# Patient Record
Sex: Male | Born: 1950 | Race: White | Hispanic: No | Marital: Married | Smoking: Former smoker
Health system: Southern US, Community
[De-identification: ages and names within clinical notes are randomized; demographics above are authoritative.]

## PROBLEM LIST (undated history)

## (undated) DIAGNOSIS — K219 Gastro-esophageal reflux disease without esophagitis: Secondary | ICD-10-CM

## (undated) DIAGNOSIS — M25519 Pain in unspecified shoulder: Secondary | ICD-10-CM

## (undated) DIAGNOSIS — M1990 Unspecified osteoarthritis, unspecified site: Secondary | ICD-10-CM

## (undated) HISTORY — PX: REFRACTIVE SURGERY: SHX103

---

## 2000-06-01 HISTORY — PX: TOTAL KNEE ARTHROPLASTY: SHX125

## 2000-08-16 ENCOUNTER — Inpatient Hospital Stay (HOSPITAL_COMMUNITY): Admission: RE | Admit: 2000-08-16 | Discharge: 2000-08-19 | Payer: Self-pay | Admitting: Orthopedic Surgery

## 2000-09-13 ENCOUNTER — Encounter: Admission: RE | Admit: 2000-09-13 | Discharge: 2000-10-27 | Payer: Self-pay | Admitting: Orthopedic Surgery

## 2012-09-16 ENCOUNTER — Encounter (HOSPITAL_BASED_OUTPATIENT_CLINIC_OR_DEPARTMENT_OTHER): Payer: Self-pay

## 2012-09-16 ENCOUNTER — Emergency Department (HOSPITAL_BASED_OUTPATIENT_CLINIC_OR_DEPARTMENT_OTHER): Payer: Federal, State, Local not specified - PPO

## 2012-09-16 ENCOUNTER — Emergency Department (HOSPITAL_BASED_OUTPATIENT_CLINIC_OR_DEPARTMENT_OTHER)
Admission: EM | Admit: 2012-09-16 | Discharge: 2012-09-16 | Disposition: A | Discharge status: Home / Self Care | Payer: Federal, State, Local not specified - PPO | Attending: Emergency Medicine | Admitting: Emergency Medicine

## 2012-09-16 DIAGNOSIS — J069 Acute upper respiratory infection, unspecified: Secondary | ICD-10-CM | POA: Insufficient documentation

## 2012-09-16 DIAGNOSIS — R51 Headache: Secondary | ICD-10-CM | POA: Insufficient documentation

## 2012-09-16 DIAGNOSIS — J209 Acute bronchitis, unspecified: Secondary | ICD-10-CM | POA: Insufficient documentation

## 2012-09-16 DIAGNOSIS — J3489 Other specified disorders of nose and nasal sinuses: Secondary | ICD-10-CM | POA: Insufficient documentation

## 2012-09-16 DIAGNOSIS — Z8639 Personal history of other endocrine, nutritional and metabolic disease: Secondary | ICD-10-CM | POA: Insufficient documentation

## 2012-09-16 DIAGNOSIS — J40 Bronchitis, not specified as acute or chronic: Secondary | ICD-10-CM

## 2012-09-16 DIAGNOSIS — Z862 Personal history of diseases of the blood and blood-forming organs and certain disorders involving the immune mechanism: Secondary | ICD-10-CM | POA: Insufficient documentation

## 2012-09-16 DIAGNOSIS — Z79899 Other long term (current) drug therapy: Secondary | ICD-10-CM | POA: Insufficient documentation

## 2012-09-16 MED ORDER — AZITHROMYCIN 250 MG PO TABS: ORAL_TABLET | ORAL | Status: DC

## 2012-09-16 MED ORDER — ALBUTEROL SULFATE HFA 108 (90 BASE) MCG/ACT IN AERS: 1.0000 | INHALATION_SPRAY | Freq: Four times a day (QID) | RESPIRATORY_TRACT | Status: DC | PRN

## 2012-09-16 NOTE — ED Notes (Signed)
Pt states that he has had URI symptoms x1 week, pcp not available until Monday.

## 2012-09-16 NOTE — ED Provider Notes (Signed)
History     CSN: [id number]  Arrival date & time 09/16/12  1649   First MD Initiated Contact with Patient 09/16/12 1819      Chief Complaint  Patient presents with  . URI    (Consider location/radiation/quality/duration/timing/severity/associated sxs/prior treatment) Patient is a 61 y.o. male presenting with URI. The history is provided by the patient. A language interpreter was used.  URI Presenting symptoms: congestion and cough   Severity:  Severe Onset quality:  Gradual Duration:  1 week Timing:  Constant Progression:  Worsening Chronicity:  New Relieved by:  None tried Worsened by:  Nothing tried Ineffective treatments:  OTC medications Associated symptoms: headaches and sinus pain   Risk factors: not elderly   Pt complains of pain in his sinuses.  Pt reports he is coughing up dark phelgm.   Pt reports he has had a lot of sinus congestion.  Pt has appointment with MD on Monday but did not want to wait if he needed an antibiotic  Past Medical History  Diagnosis Date  . Hyperlipidemia     Past Surgical History  Procedure Laterality Date  . Total knee arthroplasty      History reviewed. No pertinent family history.  History  Substance Use Topics  . Smoking status: Never Smoker   . Smokeless tobacco: Never Used  . Alcohol Use: Yes     Comment: social use      Review of Systems  HENT: Positive for congestion.   Respiratory: Positive for cough.   Neurological: Positive for headaches.  All other systems reviewed and are negative.    Allergies  Morphine and related  Home Medications   Current Outpatient Rx  Name  Route  Sig  Dispense  Refill  . ibuprofen (ADVIL,MOTRIN) 200 MG tablet   Oral   Take 200 mg by mouth every 4 (four) hours as needed for pain.         Marland Kitchen loratadine (CLARITIN) 10 MG tablet   Oral   Take 10 mg by mouth daily.         Marland Kitchen zolpidem (AMBIEN) 5 MG tablet   Oral   Take 5 mg by mouth at bedtime as needed for sleep.          Marland Kitchen albuterol (PROVENTIL HFA;VENTOLIN HFA) 108 (90 BASE) MCG/ACT inhaler   Inhalation   Inhale 1-2 puffs into the lungs every 6 (six) hours as needed for wheezing.   1 Inhaler   0   . azithromycin (ZITHROMAX Z-PAK) 250 MG tablet      2 tablets 1st day then 1 tablet a day days 2-5   6 tablet   0     BP 119/77  Pulse 71  Temp(Src) 97.9 F (36.6 C) (Oral)  Resp 20  Ht 5\' 8"  (1.727 m)  Wt 220 lb (99.791 kg)  BMI 33.46 kg/m2  SpO2 96%  Physical Exam  Nursing note and vitals reviewed. Constitutional: He appears well-developed and well-nourished.  HENT:  Head: Normocephalic.  Right Ear: External ear normal.  Left Ear: External ear normal.  Mouth/Throat: Oropharynx is clear and moist.  Eyes: Conjunctivae are normal. Pupils are equal, round, and reactive to light.  Neck: Normal range of motion. Neck supple.  Cardiovascular: Normal rate and normal heart sounds.   Pulmonary/Chest: Effort normal and breath sounds normal.  Abdominal: Soft. Bowel sounds are normal.  Musculoskeletal: Normal range of motion.  Neurological: He is alert.  Skin: Skin is warm.  Psychiatric: He has a normal  mood and affect.    ED Course  Procedures (including critical care time)  Labs Reviewed - No data to display Dg Chest 2 View  09/16/2012  *RADIOLOGY REPORT*  Clinical Data: Cough, congestion, wheezing.  CHEST - 2 VIEW  Comparison: None.  Findings: Mild hyperinflation. Heart and mediastinal contours are within normal limits.  No focal opacities or effusions.  No acute bony abnormality.  IMPRESSION: Mild COPD.   Original Report Authenticated By: Charlett Nose, M.D.      1. Bronchitis       MDM  Pt given rx for zithromax and albuterol.  Pt given disc of his chest xray for his MD.  Pt advised to see his MD as scheduled for evaluation on Tuesday.         Elson Areas, PA-C 09/16/12 2013

## 2012-09-17 NOTE — ED Provider Notes (Signed)
Medical screening examination/treatment/procedure(s) were performed by non-physician practitioner and as supervising physician I was immediately available for consultation/collaboration.   Erich Kochan III, MD 09/17/12 0033 

## 2013-11-22 ENCOUNTER — Other Ambulatory Visit (HOSPITAL_COMMUNITY): Payer: Self-pay | Admitting: Orthopaedic Surgery

## 2013-11-29 ENCOUNTER — Encounter (HOSPITAL_COMMUNITY): Payer: Self-pay | Admitting: Pharmacy Technician

## 2013-12-02 DIAGNOSIS — M25519 Pain in unspecified shoulder: Secondary | ICD-10-CM

## 2013-12-02 HISTORY — DX: Pain in unspecified shoulder: M25.519

## 2013-12-05 ENCOUNTER — Encounter (INDEPENDENT_AMBULATORY_CARE_PROVIDER_SITE_OTHER): Payer: Self-pay

## 2013-12-05 ENCOUNTER — Encounter (HOSPITAL_COMMUNITY)
Admission: RE | Admit: 2013-12-05 | Discharge: 2013-12-05 | Disposition: A | Discharge status: Home / Self Care | Payer: Medicare Other | Source: Ambulatory Visit | Attending: Orthopaedic Surgery | Admitting: Orthopaedic Surgery

## 2013-12-05 ENCOUNTER — Encounter (HOSPITAL_COMMUNITY): Payer: Self-pay

## 2013-12-05 DIAGNOSIS — Z01812 Encounter for preprocedural laboratory examination: Secondary | ICD-10-CM | POA: Insufficient documentation

## 2013-12-05 HISTORY — DX: Pain in unspecified shoulder: M25.519

## 2013-12-05 HISTORY — DX: Unspecified osteoarthritis, unspecified site: M19.90

## 2013-12-05 HISTORY — DX: Gastro-esophageal reflux disease without esophagitis: K21.9

## 2013-12-05 LAB — BASIC METABOLIC PANEL
ANION GAP: 13 (ref 5–15)
BUN: 17 mg/dL (ref 6–23)
CALCIUM: 9.2 mg/dL (ref 8.4–10.5)
CO2: 23 meq/L (ref 19–32)
Chloride: 99 mEq/L (ref 96–112)
Creatinine, Ser: 0.85 mg/dL (ref 0.50–1.35)
GFR, EST NON AFRICAN AMERICAN: 89 mL/min — AB (ref >90)
Glucose, Bld: 84 mg/dL (ref 70–99)
Potassium: 4.3 mEq/L (ref 3.7–5.3)
SODIUM: 135 meq/L — AB (ref 137–147)

## 2013-12-05 LAB — CBC
HCT: 41.7 % (ref 39.0–52.0)
Hemoglobin: 14.7 g/dL (ref 13.0–17.0)
MCH: 31.9 pg (ref 26.0–34.0)
MCHC: 35.3 g/dL (ref 30.0–36.0)
MCV: 90.5 fL (ref 78.0–100.0)
PLATELETS: 194 10*3/uL (ref 150–400)
RBC: 4.61 MIL/uL (ref 4.22–5.81)
RDW: 13.1 % (ref 11.5–15.5)
WBC: 6.4 10*3/uL (ref 4.0–10.5)

## 2013-12-05 LAB — URINALYSIS, ROUTINE W REFLEX MICROSCOPIC
Bilirubin Urine: NEGATIVE
Glucose, UA: NEGATIVE mg/dL
Hgb urine dipstick: NEGATIVE
Ketones, ur: NEGATIVE mg/dL
Leukocytes, UA: NEGATIVE
NITRITE: NEGATIVE
PROTEIN: NEGATIVE mg/dL
SPECIFIC GRAVITY, URINE: 1.021 (ref 1.005–1.030)
UROBILINOGEN UA: 0.2 mg/dL (ref 0.0–1.0)
pH: 7.5 (ref 5.0–8.0)

## 2013-12-05 LAB — SURGICAL PCR SCREEN
MRSA, PCR: NEGATIVE
STAPHYLOCOCCUS AUREUS: NEGATIVE

## 2013-12-05 LAB — PROTIME-INR
INR: 0.97 (ref 0.00–1.49)
Prothrombin Time: 12.9 seconds (ref 11.6–15.2)

## 2013-12-05 LAB — APTT: APTT: 30 s (ref 24–37)

## 2013-12-05 NOTE — Patient Instructions (Addendum)
18 Jeremy Norman  12/05/2013   Your procedure is scheduled on: Friday 12/15/13  Report to [facility] at 10:00 AM.  Call this number if you have problems the morning of surgery [phone]: [phone]   Remember:   Do not eat food or drink liquids After Midnight.   Do not wear jewelry, make-up or nail polish.  Do not wear lotions, powders, or perfumes. You may wear deodorant.  Do not shave 48 hours prior to surgery. Men may shave face and neck.  Do not bring valuables to the hospital.  Contacts, dentures or bridgework may not be worn into surgery.  Leave suitcase in the car. After surgery it may be brought to your room.  For patients admitted to the hospital, checkout time is 11:00 AM the day of discharge.   Please read over the following fact sheets that you were given: MRSA Information Paulette Blanch, RN  pre op nurse call if needed [phone]    [facility] - Preparing for Surgery Before surgery, you can play an important role.  Because skin is not sterile, your skin needs to be as free of germs as possible.  You can reduce the number of germs on your skin by washing with CHG (chlorahexidine gluconate) soap before surgery.  CHG is an antiseptic cleaner which kills germs and bonds with the skin to continue killing germs even after washing. Please DO NOT use if you have an allergy to CHG or antibacterial soaps.  If your skin becomes reddened/irritated stop using the CHG and inform your nurse when you arrive at Short Stay. Do not shave (including legs and underarms) for at least 48 hours prior to the first CHG shower.  You may shave your face/neck. Please follow these instructions carefully:  1.  Shower with CHG Soap the night before surgery and the  morning of Surgery.  2.  If you choose to wash your hair, wash your hair first as usual with your  normal  shampoo.  3.  After you shampoo, rinse your hair and body thoroughly to remove the  shampoo.                            4.   Use CHG as you would any other liquid soap.  You can apply chg directly  to the skin and wash                       Gently with a scrungie or clean washcloth.  5.  Apply the CHG Soap to your body ONLY FROM THE NECK DOWN.   Do not use on face/ open                           Wound or open sores. Avoid contact with eyes, ears mouth and genitals (private parts).                       Wash face,  Genitals (private parts) with your normal soap.             6.  Wash thoroughly, paying special attention to the area where your surgery  will be performed.  7.  Thoroughly rinse your body with warm water from the neck down.  8.  DO NOT shower/wash with your normal soap after using and rinsing off  the CHG Soap.  9.  Pat yourself dry with a clean towel.            10.  Wear clean pajamas.            11.  Place clean sheets on your bed the night of your first shower and do not  sleep with pets. Day of Surgery : Do not apply any lotions/deodorants the morning of surgery.  Please wear clean clothes to the hospital/surgery center.  FAILURE TO FOLLOW THESE INSTRUCTIONS MAY RESULT IN THE CANCELLATION OF YOUR SURGERY PATIENT SIGNATURE_________________________________  NURSE SIGNATURE__________________________________  ________________________________________________________________________  WHAT IS A BLOOD TRANSFUSION? Blood Transfusion Information  A transfusion is the replacement of blood or some of its parts. Blood is made up of multiple cells which provide different functions.  Red blood cells carry oxygen and are used for blood loss replacement.  White blood cells fight against infection.  Platelets control bleeding.  Plasma helps clot blood.  Other blood products are available for specialized needs, such as hemophilia or other clotting disorders. BEFORE THE TRANSFUSION  Who gives blood for transfusions?   Healthy volunteers who are fully evaluated to make sure their blood is  safe. This is blood bank blood. Transfusion therapy is the safest it has ever been in the practice of medicine. Before blood is taken from a donor, a complete history is taken to make sure that person has no history of diseases nor engages in risky social behavior (examples are intravenous drug use or sexual activity with multiple partners). The donor's travel history is screened to minimize risk of transmitting infections, such as malaria. The donated blood is tested for signs of infectious diseases, such as HIV and hepatitis. The blood is then tested to be sure it is compatible with you in order to minimize the chance of a transfusion reaction. If you or a relative donates blood, this is often done in anticipation of surgery and is not appropriate for emergency situations. It takes many days to process the donated blood. RISKS AND COMPLICATIONS Although transfusion therapy is very safe and saves many lives, the main dangers of transfusion include:   Getting an infectious disease.  Developing a transfusion reaction. This is an allergic reaction to something in the blood you were given. Every precaution is taken to prevent this. The decision to have a blood transfusion has been considered carefully by your caregiver before blood is given. Blood is not given unless the benefits outweigh the risks. AFTER THE TRANSFUSION  Right after receiving a blood transfusion, you will usually feel much better and more energetic. This is especially true if your red blood cells have gotten low (anemic). The transfusion raises the level of the red blood cells which carry oxygen, and this usually causes an energy increase.  The nurse administering the transfusion will monitor you carefully for complications. HOME CARE INSTRUCTIONS  No special instructions are needed after a transfusion. You may find your energy is better. Speak with your caregiver about any limitations on activity for underlying diseases you may  have. SEEK MEDICAL CARE IF:   Your condition is not improving after your transfusion.  You develop redness or irritation at the intravenous (IV) site. SEEK IMMEDIATE MEDICAL CARE IF:  Any of the following symptoms occur over the next 12 hours:  Shaking chills.  You have a temperature by mouth above 102 F (38.9 C), not controlled by medicine.  Chest, back, or muscle pain.  People around you feel you are not acting correctly or  are confused.  Shortness of breath or difficulty breathing.  Dizziness and fainting.  You get a rash or develop hives.  You have a decrease in urine output.  Your urine turns a dark color or changes to pink, red, or brown. Any of the following symptoms occur over the next 10 days:  You have a temperature by mouth above 102 F (38.9 C), not controlled by medicine.  Shortness of breath.  Weakness after normal activity.  The white part of the eye turns yellow (jaundice).  You have a decrease in the amount of urine or are urinating less often.  Your urine turns a dark color or changes to pink, red, or brown. Document Released: 05/15/2000 Document Revised: 08/10/2011 Document Reviewed: 01/02/2008 [facility] Patient Information 2014 [place], [place].  _______________________________________________________________________

## 2013-12-15 ENCOUNTER — Inpatient Hospital Stay (HOSPITAL_COMMUNITY): Payer: Medicare Other

## 2013-12-15 ENCOUNTER — Inpatient Hospital Stay (HOSPITAL_COMMUNITY)
Admission: RE | Admit: 2013-12-15 | Discharge: 2013-12-17 | DRG: 470 | Disposition: A | Discharge status: Home Health | Payer: Medicare Other | Source: Ambulatory Visit | Attending: Orthopaedic Surgery | Admitting: Orthopaedic Surgery

## 2013-12-15 ENCOUNTER — Inpatient Hospital Stay (HOSPITAL_COMMUNITY): Payer: Medicare Other | Admitting: Anesthesiology

## 2013-12-15 ENCOUNTER — Encounter (HOSPITAL_COMMUNITY): Payer: Self-pay | Admitting: *Deleted

## 2013-12-15 ENCOUNTER — Encounter (HOSPITAL_COMMUNITY): Payer: Medicare Other | Admitting: Anesthesiology

## 2013-12-15 ENCOUNTER — Encounter (HOSPITAL_COMMUNITY)
Admission: RE | Disposition: A | Discharge status: Home Health | Payer: Self-pay | Source: Ambulatory Visit | Attending: Orthopaedic Surgery

## 2013-12-15 DIAGNOSIS — Z01812 Encounter for preprocedural laboratory examination: Secondary | ICD-10-CM | POA: Diagnosis not present

## 2013-12-15 DIAGNOSIS — M25459 Effusion, unspecified hip: Secondary | ICD-10-CM | POA: Diagnosis present

## 2013-12-15 DIAGNOSIS — M1610 Unilateral primary osteoarthritis, unspecified hip: Secondary | ICD-10-CM | POA: Diagnosis present

## 2013-12-15 DIAGNOSIS — Z87891 Personal history of nicotine dependence: Secondary | ICD-10-CM | POA: Diagnosis not present

## 2013-12-15 DIAGNOSIS — Z96659 Presence of unspecified artificial knee joint: Secondary | ICD-10-CM | POA: Diagnosis not present

## 2013-12-15 DIAGNOSIS — M76899 Other specified enthesopathies of unspecified lower limb, excluding foot: Secondary | ICD-10-CM | POA: Diagnosis present

## 2013-12-15 DIAGNOSIS — K219 Gastro-esophageal reflux disease without esophagitis: Secondary | ICD-10-CM | POA: Diagnosis present

## 2013-12-15 DIAGNOSIS — M25559 Pain in unspecified hip: Secondary | ICD-10-CM | POA: Diagnosis present

## 2013-12-15 DIAGNOSIS — Z885 Allergy status to narcotic agent status: Secondary | ICD-10-CM | POA: Diagnosis not present

## 2013-12-15 DIAGNOSIS — Z96649 Presence of unspecified artificial hip joint: Secondary | ICD-10-CM

## 2013-12-15 DIAGNOSIS — M1611 Unilateral primary osteoarthritis, right hip: Secondary | ICD-10-CM

## 2013-12-15 DIAGNOSIS — M169 Osteoarthritis of hip, unspecified: Principal | ICD-10-CM | POA: Diagnosis present

## 2013-12-15 HISTORY — PX: TOTAL HIP ARTHROPLASTY: SHX124

## 2013-12-15 LAB — ABO/RH: ABO/RH(D): A POS

## 2013-12-15 LAB — TYPE AND SCREEN
ABO/RH(D): A POS
ANTIBODY SCREEN: NEGATIVE

## 2013-12-15 SURGERY — ARTHROPLASTY, HIP, TOTAL, ANTERIOR APPROACH
Anesthesia: Spinal | Site: Hip | Laterality: Right

## 2013-12-15 MED ORDER — POLYETHYLENE GLYCOL 3350 17 G PO PACK
17.0000 g | PACK | Freq: Every day | ORAL | Status: DC | PRN
  Administered 2013-12-16: 17 g via ORAL

## 2013-12-15 MED ORDER — OXYCODONE HCL 5 MG PO TABS
5.0000 mg | ORAL_TABLET | ORAL | Status: DC | PRN
  Administered 2013-12-15 – 2013-12-16 (×6): 10 mg via ORAL
  Administered 2013-12-16: 15 mg via ORAL
  Administered 2013-12-16 – 2013-12-17 (×6): 10 mg via ORAL
  Filled 2013-12-15 (×9): qty 2
  Filled 2013-12-15: qty 3
  Filled 2013-12-15 (×3): qty 2

## 2013-12-15 MED ORDER — HYDROMORPHONE HCL PF 1 MG/ML IJ SOLN: 1.0000 mg | INTRAMUSCULAR | Status: DC | PRN

## 2013-12-15 MED ORDER — TRANEXAMIC ACID 100 MG/ML IV SOLN
1000.0000 mg | INTRAVENOUS | Status: AC
  Administered 2013-12-15: 1000 mg via INTRAVENOUS
  Filled 2013-12-15: qty 10

## 2013-12-15 MED ORDER — PROMETHAZINE HCL 25 MG/ML IJ SOLN: 6.2500 mg | INTRAMUSCULAR | Status: DC | PRN

## 2013-12-15 MED ORDER — DIPHENHYDRAMINE HCL 12.5 MG/5ML PO ELIX: 12.5000 mg | ORAL_SOLUTION | ORAL | Status: DC | PRN

## 2013-12-15 MED ORDER — FENTANYL CITRATE 0.05 MG/ML IJ SOLN
INTRAMUSCULAR | Status: DC | PRN
  Administered 2013-12-15: 50 ug via INTRAVENOUS

## 2013-12-15 MED ORDER — BUPIVACAINE HCL (PF) 0.5 % IJ SOLN
INTRAMUSCULAR | Status: AC
  Filled 2013-12-15: qty 30

## 2013-12-15 MED ORDER — SODIUM CHLORIDE 0.9 % IR SOLN
Status: DC | PRN
  Administered 2013-12-15: 1000 mL

## 2013-12-15 MED ORDER — METOCLOPRAMIDE HCL 5 MG/ML IJ SOLN
5.0000 mg | Freq: Three times a day (TID) | INTRAMUSCULAR | Status: DC | PRN
  Administered 2013-12-15: 10 mg via INTRAVENOUS
  Filled 2013-12-15: qty 2

## 2013-12-15 MED ORDER — SODIUM CHLORIDE 0.9 % IV SOLN
INTRAVENOUS | Status: DC
  Administered 2013-12-15 – 2013-12-16 (×2): via INTRAVENOUS

## 2013-12-15 MED ORDER — ALUM & MAG HYDROXIDE-SIMETH 200-200-20 MG/5ML PO SUSP: 30.0000 mL | ORAL | Status: DC | PRN

## 2013-12-15 MED ORDER — MIDAZOLAM HCL 5 MG/5ML IJ SOLN
INTRAMUSCULAR | Status: DC | PRN
  Administered 2013-12-15: 2 mg via INTRAVENOUS

## 2013-12-15 MED ORDER — PHENYLEPHRINE HCL 10 MG/ML IJ SOLN
INTRAMUSCULAR | Status: AC
  Filled 2013-12-15: qty 1

## 2013-12-15 MED ORDER — MEPERIDINE HCL 50 MG/ML IJ SOLN: 6.2500 mg | INTRAMUSCULAR | Status: DC | PRN

## 2013-12-15 MED ORDER — PROPOFOL 10 MG/ML IV BOLUS
INTRAVENOUS | Status: DC | PRN
  Administered 2013-12-15: 40 mg via INTRAVENOUS

## 2013-12-15 MED ORDER — FENTANYL CITRATE 0.05 MG/ML IJ SOLN
INTRAMUSCULAR | Status: AC
  Filled 2013-12-15: qty 2

## 2013-12-15 MED ORDER — MENTHOL 3 MG MT LOZG: 1.0000 | LOZENGE | OROMUCOSAL | Status: DC | PRN

## 2013-12-15 MED ORDER — BUPIVACAINE HCL (PF) 0.5 % IJ SOLN
INTRAMUSCULAR | Status: DC | PRN
  Administered 2013-12-15: 3 mL

## 2013-12-15 MED ORDER — MIDAZOLAM HCL 2 MG/2ML IJ SOLN
INTRAMUSCULAR | Status: AC
  Filled 2013-12-15: qty 2

## 2013-12-15 MED ORDER — ZOLPIDEM TARTRATE 5 MG PO TABS: 5.0000 mg | ORAL_TABLET | Freq: Every evening | ORAL | Status: DC | PRN

## 2013-12-15 MED ORDER — METHOCARBAMOL 500 MG PO TABS
500.0000 mg | ORAL_TABLET | Freq: Four times a day (QID) | ORAL | Status: DC | PRN
  Administered 2013-12-15 – 2013-12-17 (×5): 500 mg via ORAL
  Filled 2013-12-15 (×5): qty 1

## 2013-12-15 MED ORDER — ASPIRIN EC 325 MG PO TBEC
325.0000 mg | DELAYED_RELEASE_TABLET | Freq: Two times a day (BID) | ORAL | Status: DC
  Administered 2013-12-15 – 2013-12-16 (×3): 325 mg via ORAL
  Filled 2013-12-15 (×6): qty 1

## 2013-12-15 MED ORDER — PHENOL 1.4 % MT LIQD: 1.0000 | OROMUCOSAL | Status: DC | PRN

## 2013-12-15 MED ORDER — PROPOFOL 10 MG/ML IV BOLUS
INTRAVENOUS | Status: AC
  Filled 2013-12-15: qty 20

## 2013-12-15 MED ORDER — CEFAZOLIN SODIUM 1-5 GM-% IV SOLN
1.0000 g | Freq: Four times a day (QID) | INTRAVENOUS | Status: AC
  Administered 2013-12-15 (×2): 1 g via INTRAVENOUS
  Filled 2013-12-15 (×2): qty 50

## 2013-12-15 MED ORDER — PROPOFOL INFUSION 10 MG/ML OPTIME
INTRAVENOUS | Status: DC | PRN
  Administered 2013-12-15: 75 ug/kg/min via INTRAVENOUS

## 2013-12-15 MED ORDER — CEFAZOLIN SODIUM-DEXTROSE 2-3 GM-% IV SOLR
INTRAVENOUS | Status: AC
  Filled 2013-12-15: qty 50

## 2013-12-15 MED ORDER — TRAMADOL HCL 50 MG PO TABS: 100.0000 mg | ORAL_TABLET | Freq: Four times a day (QID) | ORAL | Status: DC | PRN

## 2013-12-15 MED ORDER — METOCLOPRAMIDE HCL 10 MG PO TABS: 5.0000 mg | ORAL_TABLET | Freq: Three times a day (TID) | ORAL | Status: DC | PRN

## 2013-12-15 MED ORDER — HYDROMORPHONE HCL PF 1 MG/ML IJ SOLN: 0.2500 mg | INTRAMUSCULAR | Status: DC | PRN

## 2013-12-15 MED ORDER — PHENYLEPHRINE HCL 10 MG/ML IJ SOLN
10.0000 mg | INTRAVENOUS | Status: DC | PRN
  Administered 2013-12-15: 40 ug/min via INTRAVENOUS

## 2013-12-15 MED ORDER — OXYCODONE HCL 5 MG PO TABS: 5.0000 mg | ORAL_TABLET | Freq: Once | ORAL | Status: DC | PRN

## 2013-12-15 MED ORDER — DOCUSATE SODIUM 100 MG PO CAPS
100.0000 mg | ORAL_CAPSULE | Freq: Two times a day (BID) | ORAL | Status: DC
  Administered 2013-12-15 – 2013-12-16 (×3): 100 mg via ORAL

## 2013-12-15 MED ORDER — ONDANSETRON HCL 4 MG/2ML IJ SOLN
4.0000 mg | Freq: Four times a day (QID) | INTRAMUSCULAR | Status: DC | PRN
  Administered 2013-12-17: 4 mg via INTRAVENOUS
  Filled 2013-12-15: qty 2

## 2013-12-15 MED ORDER — ONDANSETRON HCL 4 MG PO TABS: 4.0000 mg | ORAL_TABLET | Freq: Four times a day (QID) | ORAL | Status: DC | PRN

## 2013-12-15 MED ORDER — LACTATED RINGERS IV SOLN
INTRAVENOUS | Status: DC
  Administered 2013-12-15 (×2): via INTRAVENOUS

## 2013-12-15 MED ORDER — 0.9 % SODIUM CHLORIDE (POUR BTL) OPTIME
TOPICAL | Status: DC | PRN
  Administered 2013-12-15: 1000 mL

## 2013-12-15 MED ORDER — ACETAMINOPHEN 325 MG PO TABS: 650.0000 mg | ORAL_TABLET | Freq: Four times a day (QID) | ORAL | Status: DC | PRN

## 2013-12-15 MED ORDER — CEFAZOLIN SODIUM-DEXTROSE 2-3 GM-% IV SOLR
2.0000 g | INTRAVENOUS | Status: AC
  Administered 2013-12-15: 2 g via INTRAVENOUS

## 2013-12-15 MED ORDER — OXYCODONE HCL 5 MG/5ML PO SOLN
5.0000 mg | Freq: Once | ORAL | Status: DC | PRN
  Filled 2013-12-15: qty 5

## 2013-12-15 MED ORDER — ACETAMINOPHEN 650 MG RE SUPP: 650.0000 mg | Freq: Four times a day (QID) | RECTAL | Status: DC | PRN

## 2013-12-15 MED ORDER — METHOCARBAMOL 1000 MG/10ML IJ SOLN
500.0000 mg | Freq: Four times a day (QID) | INTRAVENOUS | Status: DC | PRN
  Administered 2013-12-15: 500 mg via INTRAVENOUS
  Filled 2013-12-15: qty 5

## 2013-12-15 SURGICAL SUPPLY — 46 items
BAG ZIPLOCK 12X15 (MISCELLANEOUS) IMPLANT
BENZOIN TINCTURE PRP APPL 2/3 (GAUZE/BANDAGES/DRESSINGS) ×3 IMPLANT
BLADE SAW SGTL 18X1.27X75 (BLADE) ×2 IMPLANT
BLADE SAW SGTL 18X1.27X75MM (BLADE) ×1
CAPT HIP PF COP ×3 IMPLANT
CELLS DAT CNTRL 66122 CELL SVR (MISCELLANEOUS) ×1 IMPLANT
CLOSURE WOUND 1/2 X4 (GAUZE/BANDAGES/DRESSINGS) ×1
COVER PERINEAL POST (MISCELLANEOUS) ×3 IMPLANT
DRAPE C-ARM 42X120 X-RAY (DRAPES) ×3 IMPLANT
DRAPE STERI IOBAN 125X83 (DRAPES) ×3 IMPLANT
DRAPE U-SHAPE 47X51 STRL (DRAPES) ×9 IMPLANT
DRSG AQUACEL AG ADV 3.5X10 (GAUZE/BANDAGES/DRESSINGS) ×3 IMPLANT
DURAPREP 26ML APPLICATOR (WOUND CARE) ×3 IMPLANT
ELECT BLADE TIP CTD 4 INCH (ELECTRODE) ×3 IMPLANT
ELECT REM PT RETURN 9FT ADLT (ELECTROSURGICAL) ×3
ELECTRODE REM PT RTRN 9FT ADLT (ELECTROSURGICAL) ×1 IMPLANT
FACESHIELD WRAPAROUND (MASK) ×15 IMPLANT
GAUZE XEROFORM 1X8 LF (GAUZE/BANDAGES/DRESSINGS) IMPLANT
GLOVE BIO SURGEON STRL SZ7 (GLOVE) ×3 IMPLANT
GLOVE BIO SURGEON STRL SZ7.5 (GLOVE) ×3 IMPLANT
GLOVE BIOGEL PI IND STRL 6.5 (GLOVE) ×1 IMPLANT
GLOVE BIOGEL PI IND STRL 7.5 (GLOVE) ×1 IMPLANT
GLOVE BIOGEL PI IND STRL 8 (GLOVE) ×4 IMPLANT
GLOVE BIOGEL PI INDICATOR 6.5 (GLOVE) ×2
GLOVE BIOGEL PI INDICATOR 7.5 (GLOVE) ×2
GLOVE BIOGEL PI INDICATOR 8 (GLOVE) ×8
GLOVE ECLIPSE 7.5 STRL STRAW (GLOVE) ×3 IMPLANT
GLOVE ECLIPSE 8.0 STRL XLNG CF (GLOVE) ×6 IMPLANT
GOWN STRL REUS W/TWL XL LVL3 (GOWN DISPOSABLE) ×15 IMPLANT
HANDPIECE INTERPULSE COAX TIP (DISPOSABLE) ×2
KIT BASIN OR (CUSTOM PROCEDURE TRAY) ×3 IMPLANT
PACK TOTAL JOINT (CUSTOM PROCEDURE TRAY) ×3 IMPLANT
RTRCTR WOUND ALEXIS 18CM MED (MISCELLANEOUS) ×3
SET HNDPC FAN SPRY TIP SCT (DISPOSABLE) ×1 IMPLANT
STAPLER VISISTAT 35W (STAPLE) IMPLANT
STRIP CLOSURE SKIN 1/2X4 (GAUZE/BANDAGES/DRESSINGS) ×2 IMPLANT
SUT ETHIBOND NAB CT1 #1 30IN (SUTURE) ×3 IMPLANT
SUT MNCRL AB 4-0 PS2 18 (SUTURE) ×3 IMPLANT
SUT VIC AB 0 CT1 36 (SUTURE) ×3 IMPLANT
SUT VIC AB 1 CT1 36 (SUTURE) ×3 IMPLANT
SUT VIC AB 2-0 CT1 27 (SUTURE) ×4
SUT VIC AB 2-0 CT1 TAPERPNT 27 (SUTURE) ×2 IMPLANT
TOWEL OR 17X26 10 PK STRL BLUE (TOWEL DISPOSABLE) ×3 IMPLANT
TOWEL OR NON WOVEN STRL DISP B (DISPOSABLE) ×3 IMPLANT
TRAY FOLEY CATH 16FRSI W/METER (SET/KITS/TRAYS/PACK) ×3 IMPLANT
WATER STERILE IRR 1500ML POUR (IV SOLUTION) ×3 IMPLANT

## 2013-12-15 NOTE — Plan of Care (Signed)
Problem: Consults Goal: Diagnosis- Total Joint Replacement Right anterior total joint

## 2013-12-15 NOTE — Anesthesia Preprocedure Evaluation (Addendum)
Anesthesia Evaluation  Patient identified by MRN, date of birth, ID band Patient awake    Reviewed: Allergy & Precautions, H&P , NPO status , Patient's Chart, lab work & pertinent test results  Airway Mallampati: II TM Distance: >3 FB Neck ROM: Full    Dental  (+) Dental Advisory Given   Pulmonary former smoker,  breath sounds clear to auscultation        Cardiovascular hypertension, Rhythm:Regular Rate:Normal     Neuro/Psych negative neurological ROS  negative psych ROS   GI/Hepatic Neg liver ROS, GERD-  Medicated,  Endo/Other  negative endocrine ROS  Renal/GU negative Renal ROS     Musculoskeletal negative musculoskeletal ROS (+)   Abdominal   Peds  Hematology negative hematology ROS (+)   Anesthesia Other Findings   Reproductive/Obstetrics                          Anesthesia Physical Anesthesia Plan  ASA: II  Anesthesia Plan: Spinal   Post-op Pain Management:    Induction:   Airway Management Planned:   Additional Equipment:   Intra-op Plan:   Post-operative Plan:   Informed Consent: I have reviewed the patients History and Physical, chart, labs and discussed the procedure including the risks, benefits and alternatives for the proposed anesthesia with the patient or authorized representative who has indicated his/her understanding and acceptance.   Dental advisory given  Plan Discussed with: CRNA  Anesthesia Plan Comments:        Anesthesia Quick Evaluation

## 2013-12-15 NOTE — Brief Op Note (Signed)
12/15/2013  2:11 PM  PATIENT:  Jeremy Norman  63 y.o. male  PRE-OPERATIVE DIAGNOSIS:  Right hip osteoarthritis  POST-OPERATIVE DIAGNOSIS:  Right hip osteoarthritis  PROCEDURE:  Procedure(s): RIGHT TOTAL HIP ARTHROPLASTY ANTERIOR APPROACH (Right)  SURGEON:  Surgeon(s) and Role:    * Mcarthur Rossetti, MD - Primary  PHYSICIAN ASSISTANT: Benita Stabile, PA-C  ASSISTANTS: Shary Decamp, PA S2   ANESTHESIA:   spinal  EBL:  Total I/O In: 1000 [I.V.:1000] Out: 600 [Urine:200; Blood:400]  BLOOD ADMINISTERED:none  DRAINS: none   LOCAL MEDICATIONS USED:  NONE  SPECIMEN:  No Specimen  DISPOSITION OF SPECIMEN:  N/A  COUNTS:  YES  TOURNIQUET:  * No tourniquets in log *  DICTATION: .Other Dictation: Dictation Number [id number]  PLAN OF CARE: Admit to inpatient   PATIENT DISPOSITION:  PACU - hemodynamically stable.   Delay start of Pharmacological VTE agent (>24hrs) due to surgical blood loss or risk of bleeding: no

## 2013-12-15 NOTE — Op Note (Signed)
NAMEORVAN, PAPADAKIS               ACCOUNT NO.:  [id number]  MEDICAL RECORD NO.:  [id number]  LOCATION:  5809                         FACILITY:  [facility]  PHYSICIAN:  Lind Guest. Ninfa Linden, M.D.DATE OF BIRTH:  [birth year]  DATE OF PROCEDURE:  12/15/2013 DATE OF DISCHARGE:                              OPERATIVE REPORT   PREOPERATIVE DIAGNOSIS:  Severe end-stage arthritis and degenerative joint disease, right hip.  POSTOPERATIVE DIAGNOSIS:  Severe end-stage arthritis and degenerative joint disease, right hip.  PROCEDURE:  Right total hip arthroplasty through direct anterior approach.  IMPLANTS:  DePuy Sector Gription acetabular component size 54, with apex hole eliminator guide in a single screw, size 36 +4 neutral polyethylene liner, size 11 Corail femoral component with standard offset, size 36+ 1.5 ceramic hip ball.  SURGEON:  Lind Guest. Ninfa Linden, M.D.  ASSISTANT:  Erskine Emery, PA-C  ANESTHESIA:  Spinal.  ANTIBIOTICS:  2 g IV Ancef.  BLOOD LOSS:  300 mL.  COMPLICATIONS:  None.  INDICATIONS:  Mr. Jeremy Norman is a 63 year old gentleman well known to me. He has severe debilitating arthritis and degenerative joint disease involving his right hip.  He has failed injections, anti-inflammatories, rest, ice, heat, and time.  It is now greatly affecting his activities of daily living.  His quality of life has diminished.  His mobility is diminished.  His pain is daily and severe.  His x-ray showed complete loss of his joint space on the right hip.  There are subchondral sclerotic changes, periarticular osteophytes, flattening of the femoral head, and cystic changes as well.  Given these combination of findings, total hip arthroplasty is recommended.  He does wished to proceed with this.  He understands the risks of acute blood loss anemia, nerve and vessel injury, fracture, infection, dislocation, and DVT.  He understands the goals are decreased pain, improved mobility, and  overall improved quality of life.  PROCEDURE DESCRIPTION:  After informed consent was obtained, appropriate right hip was marked.  He was brought to the operating room and spinal anesthesia was obtained while he was on the stretcher.  Foley catheter was placed and traction boots were placed on both his feet.  Next, he was placed supine on the Hana fracture table with the perineal post in place and then both legs in inline skeletal traction devices, but no traction applied.  His right operative hip was prepped and draped with DuraPrep and sterile drapes.  A time-out was called to identify correct patient, correct right hip.  I then made an incision inferior and posterior to the anterior superior iliac spine and carried this obliquely down the leg.  I dissected down to the tensor fascia lata muscle and tensor fascia was then divided longitudinally, so we could proceed with a direct anterior approach to the hip.  We cauterized the lateral femoral circumflex vessels and then placed Cobra retractors around the lateral neck and up underneath the rectus femoris around the medial neck.  We opened up the hip capsule in an L-type format and found a large joint effusion.  We placed a Cobra retractors within the hip capsule.  I then made my femoral neck cut with an oscillating saw just proximal to the lesser  trochanter and completed this with an osteotome. I removed the femoral head in its entirety and found to be completely devoid of cartilage.  We then cleaned the acetabulum debris including remnants of acetabular labrum and periarticular osteophytes.  We placed a bent Hohmann medially and a Cobra retractor laterally.  I then began reaming from a size 42 reamer in 2 mm increments up to a size 54, with all reamers under direct visualization and the last reamer also under direct fluoroscopy so we could obtain our depth of reaming and inclination and anteversion.  Once I was pleased with this, I  placed the real DePuy Sector Gription acetabular component size 54.  The apex hole eliminator guide and a single screw.  I then placed the real 36+ 4 neutral polyethylene liner for a size 54 cup.  Attention was then turned to the femur.  With the leg externally rotated to 100 degrees, extended and adducted, we were able to place the Mueller retractor medially and Hohmann retractor behind the greater trochanter.  I released the lateral joint capsule and then used a rongeur to lateralize, and a box cutting osteotome to enter the femoral canal.  We then began broaching using the Corail broaching system and DePuy from a size 8 up to a size 11.  The 11 was felt to be stable.  We used a calcar planer off of this and trialed a standard neck and a 36+ 1.5 hip ball, we brought this the leg back up and over with traction and internal rotation, reducing the pelvis and it was stable throughout its arc of rotation.  His leg lengths and offset were measured near equal as well.  We then dislocated the hip and removed the trial components.  We replaced the real with #11 Corail femoral component with standard offset and the real 36+ 1.5 ceramic hip ball, reduced this in the acetabulum again it was stable.  We then copiously irrigated the soft tissue with normal saline solution using pulsatile lavage.  We closed the joint capsule with interrupted #1 Ethibond suture followed by a running #1 Vicryl in the tensor fascia, 0 Vicryl deep tissue, 2-0 Vicryl in subcutaneous tissue, and 4-0 Monocryl subcuticular stitch and Steri-Strips on the skin, and Aquacel dressing was applied.  He was taken off of the fracture table into the recovery room in stable condition.  All final counts were correct and no complications noted.  Of note, Erskine Emery, PA-C assisted the entire case, and his assistance was crucial for facilitating this case through patient's positioning, retracting, and later closure of the  wound.     Lind Guest. Ninfa Linden, M.D.     CYB/MEDQ  D:  12/15/2013  T:  12/15/2013  Job:  [id number]

## 2013-12-15 NOTE — Anesthesia Procedure Notes (Signed)
Spinal  Patient location during procedure: OR Start time: 12/15/2013 12:41 PM End time: 12/15/2013 12:45 PM Staffing CRNA/Resident: Anne Fu Performed by: resident/CRNA  Preanesthetic Checklist Completed: patient identified, site marked, surgical consent, pre-op evaluation, timeout performed, IV checked, risks and benefits discussed and monitors and equipment checked Spinal Block Patient position: sitting Prep: Betadine Patient monitoring: heart rate, continuous pulse ox and blood pressure Location: L2-3 Injection technique: single-shot Needle Needle type: Spinocan  Needle gauge: 22 G Needle length: 9 cm Assessment Sensory level: T4 Additional Notes Expiration date of kit checked and confirmed. Patient tolerated procedure well, without complications. X 1 attempt with noted clear CSF return easy aspiration and administration.  Noted loss of motor and sensory on exam of bilat lower ext.

## 2013-12-15 NOTE — H&P (Signed)
TOTAL HIP ADMISSION H&P  Patient is admitted for right total hip arthroplasty.  Subjective:  Chief Complaint: right hip pain  HPI: Jeremy Norman, 63 y.o. male, has a history of pain and functional disability in the right hip(s) due to arthritis and patient has failed non-surgical conservative treatments for greater than 12 weeks to include NSAID's and/or analgesics, flexibility and strengthening excercises and activity modification.  Onset of symptoms was gradual starting 1 years ago with rapidlly worsening course since that time.The patient noted no past surgery on the right hip(s).  Patient currently rates pain in the right hip at 9 out of 10 with activity. Patient has worsening of pain with activity and weight bearing, pain that interfers with activities of daily living and pain with passive range of motion. Patient has evidence of subchondral sclerosis, periarticular osteophytes and joint space narrowing by imaging studies. This condition presents safety issues increasing the risk of falls.  There is no current active infection.  Patient Active Problem List   Diagnosis Date Noted  . Arthritis of right hip 12/15/2013   Past Medical History  Diagnosis Date  . GERD (gastroesophageal reflux disease)     occasional   . Osteoarthritis   . Acute shoulder pain 12/02/13    right shoulder-fell on shoulder on 12/02/13    Past Surgical History  Procedure Laterality Date  . Total knee arthroplasty Left 2002  . Refractive surgery Bilateral 15 years ago    No prescriptions prior to admission   Allergies  Allergen Reactions  . Morphine And Related Nausea And Vomiting    History  Substance Use Topics  . Smoking status: Former Smoker -- 2.00 packs/day    Types: Cigarettes  . Smokeless tobacco: Never Used     Comment: quit smoking in mid 1980s  . Alcohol Use: Yes     Comment: 1 daily    No family history on file.   Review of Systems  Musculoskeletal: Positive for joint pain.  All other  systems reviewed and are negative.   Objective:  Physical Exam  Constitutional: He is oriented to person, place, and time. He appears well-developed and well-nourished.  HENT:  Head: Normocephalic and atraumatic.  Eyes: EOM are normal. Pupils are equal, round, and reactive to light.  Neck: Normal range of motion. Neck supple.  Cardiovascular: Normal rate and regular rhythm.   Respiratory: Effort normal and breath sounds normal.  GI: Soft. Bowel sounds are normal.  Musculoskeletal:       Right hip: He exhibits decreased range of motion, decreased strength, bony tenderness and crepitus.  Neurological: He is alert and oriented to person, place, and time.  Skin: Skin is warm and dry.  Psychiatric: He has a normal mood and affect.    Vital signs in last 24 hours:    Labs:   Estimated body mass index is 33.46 kg/(m^2) as calculated from the following:   Height as of 09/16/12: 5\' 8"  (1.727 m).   Weight as of 09/16/12: 99.791 kg (220 lb).   Imaging Review Plain radiographs demonstrate severe degenerative joint disease of the right hip(s). The bone quality appears to be good for age and reported activity level.  Assessment/Plan:  End stage arthritis, right hip(s)  The patient history, physical examination, clinical judgement of the provider and imaging studies are consistent with end stage degenerative joint disease of the right hip(s) and total hip arthroplasty is deemed medically necessary. The treatment options including medical management, injection therapy, arthroscopy and arthroplasty were discussed  at length. The risks and benefits of total hip arthroplasty were presented and reviewed. The risks due to aseptic loosening, infection, stiffness, dislocation/subluxation,  thromboembolic complications and other imponderables were discussed.  The patient acknowledged the explanation, agreed to proceed with the plan and consent was signed. Patient is being admitted for inpatient treatment  for surgery, pain control, PT, OT, prophylactic antibiotics, VTE prophylaxis, progressive ambulation and ADL's and discharge planning.The patient is planning to be discharged home with home health services

## 2013-12-15 NOTE — Anesthesia Postprocedure Evaluation (Signed)
Anesthesia Post Note  Patient: Jeremy Norman  Procedure(s) Performed: Procedure(s) (LRB): RIGHT TOTAL HIP ARTHROPLASTY ANTERIOR APPROACH (Right)  Anesthesia type: Spinal  Patient location: PACU  Post pain: Pain level controlled  Post assessment: Post-op Vital signs reviewed  Last Vitals: BP 112/68  Pulse 66  Temp(Src) 36.4 C (Oral)  Resp 16  Ht 5\' 9"  (1.753 m)  Wt 218 lb (98.884 kg)  BMI 32.18 kg/m2  SpO2 100%  Post vital signs: Reviewed  Level of consciousness: sedated  Complications: No apparent anesthesia complications

## 2013-12-15 NOTE — Transfer of Care (Signed)
Immediate Anesthesia Transfer of Care Note  Patient: Jeremy Norman  Procedure(s) Performed: Procedure(s) (LRB): RIGHT TOTAL HIP ARTHROPLASTY ANTERIOR APPROACH (Right)  Patient Location: PACU  Anesthesia Type: Spinal  Level of Consciousness: sedated, patient cooperative and responds to stimulation  Airway & Oxygen Therapy: Patient Spontanous Breathing and Patient connected to face mask oxgen  Post-op Assessment: Report given to PACU RN and Post -op Vital signs reviewed and stable  Post vital signs: Reviewed and stable  Complications: No apparent anesthesia complications

## 2013-12-16 LAB — BASIC METABOLIC PANEL
ANION GAP: 10 (ref 5–15)
BUN: 14 mg/dL (ref 6–23)
CO2: 24 mEq/L (ref 19–32)
Calcium: 8 mg/dL — ABNORMAL LOW (ref 8.4–10.5)
Chloride: 101 mEq/L (ref 96–112)
Creatinine, Ser: 0.92 mg/dL (ref 0.50–1.35)
GFR calc Af Amer: >90 mL/min (ref >90)
GFR, EST NON AFRICAN AMERICAN: 87 mL/min — AB (ref >90)
GLUCOSE: 111 mg/dL — AB (ref 70–99)
Potassium: 3.6 mEq/L — ABNORMAL LOW (ref 3.7–5.3)
Sodium: 135 mEq/L — ABNORMAL LOW (ref 137–147)

## 2013-12-16 LAB — CBC
HCT: 34.2 % — ABNORMAL LOW (ref 39.0–52.0)
Hemoglobin: 12 g/dL — ABNORMAL LOW (ref 13.0–17.0)
MCH: 32.1 pg (ref 26.0–34.0)
MCHC: 35.1 g/dL (ref 30.0–36.0)
MCV: 91.4 fL (ref 78.0–100.0)
PLATELETS: 149 10*3/uL — AB (ref 150–400)
RBC: 3.74 MIL/uL — ABNORMAL LOW (ref 4.22–5.81)
RDW: 13 % (ref 11.5–15.5)
WBC: 7.5 10*3/uL (ref 4.0–10.5)

## 2013-12-16 MED ORDER — HYDROCODONE-ACETAMINOPHEN 5-325 MG PO TABS: 1.0000 | ORAL_TABLET | ORAL | Status: DC | PRN

## 2013-12-16 MED ORDER — METHOCARBAMOL 500 MG PO TABS: 500.0000 mg | ORAL_TABLET | Freq: Four times a day (QID) | ORAL | Status: DC | PRN

## 2013-12-16 MED ORDER — ASPIRIN 325 MG PO TBEC: 325.0000 mg | DELAYED_RELEASE_TABLET | Freq: Two times a day (BID) | ORAL | Status: DC

## 2013-12-16 MED ORDER — ONDANSETRON 4 MG PO TBDP: 4.0000 mg | ORAL_TABLET | Freq: Three times a day (TID) | ORAL | Status: DC | PRN

## 2013-12-16 NOTE — Progress Notes (Signed)
CARE MANAGEMENT NOTE 12/16/2013  Patient:  Jeremy Norman, Jeremy Norman   Account Number:  [id number]  Date Initiated:  12/16/2013  Documentation initiated by:  [facility]  Subjective/Objective Assessment:   RIGHT TOTAL HIP ARTHROPLASTY ANTERIOR APPROACH     Action/Plan:   Anticipated DC Date:  12/17/2013   Anticipated DC Plan:  [place]  CM consult      [facility] Choice  HOME HEALTH   Choice offered to / List presented to:  C-1 Patient   DME arranged  3-N-1  Vassie Moselle      DME agency  [place] arranged  [place]   Status of service:   Medicare Important Message given?  NA - LOS <3 / Initial given by admissions (If response is "NO", the following Medicare IM given date fields will be blank) Date Medicare IM given:   Medicare IM given by:   Date Additional Medicare IM given:   Additional Medicare IM given by:    Discharge Disposition:  [place]  Per UR Regulation:    If discussed at Long Length of Stay Meetings, dates discussed:    Comments:  12/16/2013 1530 NCM spoke to pt and offered choice for [facility]. Pt agreeable to [facility] for [facility]. Requested RW and 3n1 for home. Notified [facility] for DME for home. Jonnie Finner RN CCM Case Mgmt phone [phone]

## 2013-12-16 NOTE — Progress Notes (Signed)
Subjective: 1 Day Post-Op Procedure(s) (LRB): RIGHT TOTAL HIP ARTHROPLASTY ANTERIOR APPROACH (Right) Patient reports pain as mild.    Objective: Vital signs in last 24 hours: Temp:  [97.5 F (36.4 C)-98.9 F (37.2 C)] 98.6 F (37 C) (07/18 1017) Pulse Rate:  [53-89] 72 (07/18 1017) Resp:  [10-22] 18 (07/18 1017) BP: (100-129)/(52-72) 100/54 mmHg (07/18 1017) SpO2:  [96 %-100 %] 98 % (07/18 1017) Weight:  [98.884 kg (218 lb)] 98.884 kg (218 lb) (07/17 1545)  Intake/Output from previous day: 07/17 0701 - 07/18 0700 In: 2861.3 [P.O.:120; I.V.:2641.3; IV Piggyback:100] Out: 8182 [Urine:1450; Blood:400] Intake/Output this shift: Total I/O In: 240 [P.O.:240] Out: 150 [Urine:150]   Recent Labs  12/16/13 0530  HGB 12.0*    Recent Labs  12/16/13 0530  WBC 7.5  RBC 3.74*  HCT 34.2*  PLT 149*    Recent Labs  12/16/13 0530  NA 135*  K 3.6*  CL 101  CO2 24  BUN 14  CREATININE 0.92  GLUCOSE 111*  CALCIUM 8.0*   No results found for this basename: LABPT, INR,  in the last 72 hours  Neurologically intact  Assessment/Plan: 1 Day Post-Op Procedure(s) (LRB): RIGHT TOTAL HIP ARTHROPLASTY ANTERIOR APPROACH (Right) Up with therapy  Saline lock iv.   Jeremy Norman C 12/16/2013, 10:29 AM

## 2013-12-16 NOTE — Evaluation (Addendum)
Physical Therapy Evaluation Patient Details Name: JADEN ABREU MRN: [id number] DOB: 01-23-1951 Today's Date: 12/16/2013   History of Present Illness  RDATHA  Clinical Impression  Pt is ambulating VERY well for post op day 1, no "limp" really. Pt will benefit from PT to address problems listed in note below. Plans Dc home.    Follow Up Recommendations Home health PT;Supervision/Assistance - 24 hour    Equipment Recommendations  Rolling walker with 5" wheels    Recommendations for Other Services       Precautions / Restrictions   fall     Mobility  Bed Mobility Overal bed mobility: Needs Assistance Bed Mobility: Supine to Sit     Supine to sit: Min guard;HOB elevated     General bed mobility comments: cues for technique  Transfers Overall transfer level: Needs assistance Equipment used: Rolling walker (2 wheeled) Transfers: Sit to/from Stand Sit to Stand: Min guard;From elevated surface         General transfer comment: cues for R leg and hand placement  Ambulation/Gait Ambulation/Gait assistance: Min guard Ambulation Distance (Feet): 300 Feet Assistive device: Rolling walker (2 wheeled) Gait Pattern/deviations: Step-through pattern     General Gait Details: barely any antalgia on R. Pt able to take a few steps without RW as pt wanted to try. Instructed pt that he  currently has to have RW until HHPT instructs in use of cane  Stairs            Wheelchair Mobility    Modified Rankin (Stroke Patients Only)       Balance                                             Pertinent Vitals/Pain R thigh is sore  Per pt.    Home Living Family/patient expects to be discharged to:: Private residence Living Arrangements: Spouse/significant other Available Help at Discharge: Family Type of Home: House Home Access: Stairs to enter Entrance Stairs-Rails: Left Entrance Stairs-Number of Steps: [place]: Two level;Able to live on  main level with bedroom/bathroom Home Equipment: None      Prior Function Level of Independence: Independent               Hand Dominance        Extremity/Trunk Assessment               Lower Extremity Assessment: RLE deficits/detail RLE Deficits / Details: able to advance and flex hip in supine without assistamce    Cervical / Trunk Assessment: Normal  Communication   Communication: No difficulties  Cognition Arousal/Alertness: Awake/alert Behavior During Therapy: WFL for tasks assessed/performed Overall Cognitive Status: Within Functional Limits for tasks assessed                      General Comments      Exercises Total Joint Exercises Ankle Circles/Pumps: AROM;Both Short Arc Quad: AROM;Right;15 reps;10 reps Heel Slides: AROM;Right;15 reps Hip ABduction/ADduction: AROM;Right;15 reps      Assessment/Plan    PT Assessment Patient needs continued PT services  PT Diagnosis Difficulty walking;Acute pain   PT Problem List Decreased range of motion;Decreased knowledge of precautions;Decreased safety awareness;Decreased knowledge of use of DME;Decreased mobility  PT Treatment Interventions DME instruction;Gait training;Stair training;Functional mobility training;Therapeutic activities;Therapeutic exercise;Patient/family education   PT Goals (Current goals can be found in the Care  Plan section) Acute Rehab PT Goals Patient Stated Goal: I want to walk without a limp PT Goal Formulation: With patient/family Time For Goal Achievement: 12/16/13    Frequency 7X/week   Barriers to discharge        Co-evaluation               End of Session   Activity Tolerance: Patient tolerated treatment well Patient left: in chair;with call bell/phone within reach;with family/visitor present Nurse Communication: Mobility status         Time: 0821-0845 PT Time Calculation (min): 24 min   Charges:   PT Evaluation $Initial PT Evaluation Tier I: 1  Procedure PT Treatments $Gait Training: 8-22 mins $Therapeutic Exercise: 8-22 mins   PT G Codes:          Claretha Cooper 12/16/2013, 9:08 AM Tresa Endo PT [phone]

## 2013-12-16 NOTE — Progress Notes (Signed)
Physical Therapy Treatment Patient Details Name: Jeremy Norman MRN: [id number] DOB: [birth year] Today's Date: 12/16/2013    History of Present Illness RDATHA    PT Comments    Pt is progressing very well. Ambulated x 800' with RW. Plans DC tomorrow after PT/stairs  Follow Up Recommendations  Home health PT;Supervision/Assistance - 24 hour     Equipment Recommendations  Rolling walker with 5" wheels    Recommendations for Other Services       Precautions / Restrictions Precautions Precautions: Fall    Mobility  Bed Mobility   Bed Mobility: Sit to Supine       Sit to supine: Supervision   General bed mobility comments: cues  for using L foot to assist R leg onto bed.  Transfers Overall transfer level: Needs assistance Equipment used: Rolling walker (2 wheeled) Transfers: Sit to/from Stand Sit to Stand: Supervision         General transfer comment: cues for R leg and hand placement  Ambulation/Gait Ambulation/Gait assistance: Supervision Ambulation Distance (Feet): 800 Feet Assistive device: Rolling walker (2 wheeled)       General Gait Details: barely any antalgiac on R.    Stairs            Wheelchair Mobility    Modified Rankin (Stroke Patients Only)       Balance                                    Cognition Arousal/Alertness: Awake/alert                          Exercises      General Comments        Pertinent Vitals/Pain sore    Home Living                      Prior Function            PT Goals (current goals can now be found in the care plan section) Progress towards PT goals: Progressing toward goals    Frequency  7X/week    PT Plan Current plan remains appropriate    Co-evaluation             End of Session   Activity Tolerance: Patient tolerated treatment well Patient left: in bed;with call bell/phone within reach     Time: 1347-1412 PT Time Calculation  (min): 25 min  Charges:  $Gait Training: 23-37 mins                    G Codes:      Claretha Cooper 12/16/2013, 2:54 PM

## 2013-12-16 NOTE — Evaluation (Signed)
Occupational Therapy Evaluation Patient Details Name: BRAXTIN BAMBA MRN: [id number] DOB: 30-Mar-1951 Today's Date: 12/16/2013    History of Present Illness RDATHA   Clinical Impression   This 63 year old man was admitted for the above surgery.  All education was completed. No further OT is needed at this time.      Follow Up Recommendations  No OT follow up    Equipment Recommendations  3 in 1 bedside comode    Recommendations for Other Services       Precautions / Restrictions Precautions Precautions: Fall Restrictions Weight Bearing Restrictions: No      Mobility Bed Mobility (done by PT) Overal bed mobility: Needs Assistance Bed Mobility: Supine to Sit     Supine to sit: Min guard;HOB elevated     General bed mobility comments: cues for technique  Transfers Overall transfer level: Needs assistance Equipment used: Rolling walker (2 wheeled) Transfers: Sit to/from Stand Sit to Stand: Min guard;From elevated surface         General transfer comment: cues for R leg and hand placement    Balance                                            ADL Overall ADL's : Needs assistance/impaired     Grooming: Supervision/safety;Standing       Lower Body Bathing: Minimal assistance;Sit to/from stand       Lower Body Dressing: Minimal assistance;Sit to/from stand   Toilet Transfer: Ambulation;BSC;Min guard   Toileting- Water quality scientist and Hygiene: Min guard;Sit to/from stand   Tub/ Shower Transfer: Walk-in shower;Min guard;Ambulation     General ADL Comments: Pt can complete UB adls with set up.  Wife will assist with LB--they do not have AE.  Practiced shower transfer and explained positioning of 3:1 in shower.  Practiced with 3:1 over commode.  Pt feels he can sit on bed without stool     Vision                     Perception     Praxis      Pertinent Vitals/Pain 5/10 R hip; repositioned and ice applied      Hand Dominance     Extremity/Trunk Assessment Upper Extremity Assessment Upper Extremity Assessment: RUE deficits/detail (hurt shoulder in fall.  Able to wfls (120) forward flexion)   Lower Extremity Assessment Lower Extremity Assessment: RLE deficits/detail RLE Deficits / Details: able to advance and flex hip in supine without assistamce   Cervical / Trunk Assessment Cervical / Trunk Assessment: Normal   Communication Communication Communication: No difficulties   Cognition Arousal/Alertness: Awake/alert Behavior During Therapy: WFL for tasks assessed/performed Overall Cognitive Status: Within Functional Limits for tasks assessed                     General Comments       Exercises       Shoulder Instructions      Home Living Family/patient expects to be discharged to:: Private residence Living Arrangements: Spouse/significant other Available Help at Discharge: Family Type of Home: House Home Access: Stairs to enter Technical brewer of Steps: 4 Entrance Stairs-Rails: Left Home Layout: Two level;Able to live on main level with bedroom/bathroom     Bathroom Shower/Tub: Occupational psychologist: Standard     Home Equipment: None  Prior Functioning/Environment Level of Independence: Independent             OT Diagnosis:     OT Problem List:     OT Treatment/Interventions:      OT Goals(Current goals can be found in the care plan section) Acute Rehab OT Goals Patient Stated Goal: I want to walk without a limp  OT Frequency:     Barriers to D/C:            Co-evaluation              End of Session    Activity Tolerance: Patient tolerated treatment well Patient left: in bed;with call bell/phone within reach;with family/visitor present   Time: 4585-9292 OT Time Calculation (min): 17 min Charges:  OT General Charges $OT Visit: 1 Procedure OT Evaluation $Initial OT Evaluation Tier I: 1 Procedure OT  Treatments $Self Care/Home Management : 8-22 mins G-Codes:    Taylin Leder Dec 22, 2013, 9:56 AM Lesle Chris, OTR/L [phone] December 22, 2013

## 2013-12-17 LAB — CBC
HEMATOCRIT: 34.2 % — AB (ref 39.0–52.0)
HEMOGLOBIN: 11.9 g/dL — AB (ref 13.0–17.0)
MCH: 31.6 pg (ref 26.0–34.0)
MCHC: 34.8 g/dL (ref 30.0–36.0)
MCV: 91 fL (ref 78.0–100.0)
Platelets: 131 10*3/uL — ABNORMAL LOW (ref 150–400)
RBC: 3.76 MIL/uL — ABNORMAL LOW (ref 4.22–5.81)
RDW: 12.8 % (ref 11.5–15.5)
WBC: 9.6 10*3/uL (ref 4.0–10.5)

## 2013-12-17 NOTE — Progress Notes (Signed)
Physical Therapy Treatment Patient Details Name: Jeremy Norman MRN: [id number] DOB: January 07, 1951 Today's Date: 12/17/2013    History of Present Illness RDATHA    PT Comments    Pt is doing extremely well, ready for DC.  Follow Up Recommendations  Home health PT;Supervision/Assistance - 24 hour     Equipment Recommendations       Recommendations for Other Services       Precautions / Restrictions Precautions Precautions: Fall    Mobility  Bed Mobility                  Transfers Overall transfer level: Modified independent Equipment used: Rolling walker (2 wheeled) Transfers: Sit to/from Stand              Ambulation/Gait Ambulation/Gait assistance: Modified independent (Device/Increase time) Ambulation Distance (Feet): 400 Feet Assistive device: Rolling walker (2 wheeled) Gait Pattern/deviations: Step-through pattern     General Gait Details: barely any antalgiac on R.    Stairs Stairs: Yes Stairs assistance: Supervision Stair Management: One rail Left;Step to pattern Number of Stairs: 2    Wheelchair Mobility    Modified Rankin (Stroke Patients Only)       Balance                                    Cognition Arousal/Alertness: Awake/alert                          Exercises Total Joint Exercises Ankle Circles/Pumps: AROM;Both Short Arc Quad: AROM;Right;15 reps;10 reps Heel Slides: AROM;Right;15 reps Hip ABduction/ADduction: AROM;Right;15 reps    General Comments        Pertinent Vitals/Pain No pain    Home Living                      Prior Function            PT Goals (current goals can now be found in the care plan section) Progress towards PT goals: Progressing toward goals    Frequency       PT Plan      Co-evaluation             End of Session   Activity Tolerance: Patient tolerated treatment well Patient left: in chair;with family/visitor present     Time:  6063-0160 PT Time Calculation (min): 20 min  Charges:  $Gait Training: 8-22 mins                    G Codes:      Claretha Cooper 12/17/2013, 1:00 PM

## 2013-12-17 NOTE — Discharge Instructions (Signed)
Hip Rehabilitation, Guidelines Following Surgery The results of a hip operation are greatly improved after range of motion and muscle strengthening exercises. Follow all safety measures which are given to protect your hip. If any of these exercises cause increased pain or swelling in your joint, decrease the amount until you are comfortable again. Then slowly increase the exercises. Call your caregiver if you have problems or questions. HOME CARE INSTRUCTIONS  Most of the following instructions are designed to prevent the dislocation of your new hip.  Do not put on socks or shoes without following the instructions of your caregivers.  Sit on high chairs so your hips are not bent more than 90 degrees.  Sit on chairs with arms. Use the chair arms to help push yourself up when arising.  Keep your leg on the side of the operation out in front of you when standing up.  Arrange for the use of a toilet seat elevator so you are not sitting low.  Do not do any exercises or get in any positions that cause your toes to point in (pigeon toed).  Always sleep with a pillow between your legs. Do not lie on your side in sleep with both knees touching the bed.  You may resume a sexual relationship in one month or when given the OK by your caregiver.  Use crutches or walker as long as suggested by your caregivers.  Begin weight bearing with your caregiver's approval.  Avoid periods of inactivity such as sitting longer than an hour when not asleep. This helps prevent blood clots.  Return to work as instructed by your caregiver.  Do not drive a car for 6 weeks or as instructed.  Do not drive while taking narcotics.  Wear elastic stockings until instructed not to.  Make sure you keep all of your appointments after your operation with all of your doctors and caregivers. RANGE OF MOTION AND STRENGTHENING EXERCISES These exercises are designed to help you keep full movement of your hip joint. Follow  your caregiver's or physical therapist's instructions. Perform all exercises about fifteen times, three times per day or as directed. Exercise both hips, even if you have had only one joint replacement. These exercises can be done on a training (exercise) mat, on the floor, on a table or on a bed. Use whatever works the best and is most comfortable for you. Use music or television while you are exercising so that the exercises are a pleasant break in your day. This will make your life better with the exercises acting as a break in routine you can look forward to.  Lying on your back, slowly slide your foot toward your buttocks, raising your knee up off the floor. Then slowly slide your foot back down until your leg is straight again.  Lying on your back spread your legs as far apart as you can without causing discomfort.  Lying on your side, raise your upper leg and foot straight up from the floor as far as is comfortable. Slowly lower the leg and repeat.  Lying on your back, tighten up the muscle in the front of your thigh (quadriceps muscles). You can do this by keeping your leg straight and trying to raise your heel off the floor. This helps strengthen the largest muscle supporting your knee.  Lying on your back, tighten up the muscles of your buttocks both with the legs straight and with the knee bent at a comfortable angle while keeping your heel on the floor.  Lying on your stomach, lift your toes off the floor towards your buttocks. Bend your knee as far as is comfortable. Tighten the muscles in the buttocks while doing this. Document Released: 12/20/2003 Document Revised: 08/10/2011 Document Reviewed: 12/07/2007 [facility] Patient Information 2015 [place], [place]. This information is not intended to replace advice given to you by your health care provider. Make sure you discuss any questions you have with your health care provider.  Total Hip Replacement Care After Refer to this sheet in the  next few weeks. These instructions provide you with information on caring for yourself after your procedure. Your caregiver also may give you specific instructions. Your treatment has been planned according to the most current medical practices, but problems sometimes occur. Call your caregiver if you have any problems or questions after your procedure. HOME CARE INSTRUCTIONS  Your caregiver will give you specific precautions for certain types of movement. Additional instructions include:  Take over-the-counter or prescription medicines for pain, discomfort, or fever only as directed by your caregiver.  Take quick showers (3-5 min) rather than bathe until your caregiver tells you that you can take baths again.  Avoid lifting until your caregiver instructs you otherwise.  Use a raised toilet seat and avoid sitting in low chairs as instructed by your caregiver.  Use crutches or a walker as instructed by your caregiver. SEEK MEDICAL CARE IF:  You have difficulty breathing.  Your wound is red, swollen, or has become increasingly painful.  You have pus draining from your wound.  You have a bad smell coming from your wound.  You have persistent bleeding from your wound.  Your wound breaks open after sutures (stitches) or staples have been removed. SEEK IMMEDIATE MEDICAL CARE IF:   You have a fever.  You have a rash.  You have pain or swelling in your calf or thigh.  You have shortness of breath or chest pain. MAKE SURE YOU:  Understand these instructions.  Will watch your condition.  Will get help right away if you are not doing well or get worse. Document Released: 12/05/2004 Document Revised: 11/17/2011 Document Reviewed: 07/05/2011 [facility] Patient Information 2015 [place], [place]. This information is not intended to replace advice given to you by your health care provider. Make sure you discuss any questions you have with your health care provider.  Hip Rehabilitation,  Guidelines Following Surgery The results of a hip operation are greatly improved after range of motion and muscle strengthening exercises. Follow all safety measures which are given to protect your hip. If any of these exercises cause increased pain or swelling in your joint, decrease the amount until you are comfortable again. Then slowly increase the exercises. Call your caregiver if you have problems or questions. HOME CARE INSTRUCTIONS  Most of the following instructions are designed to prevent the dislocation of your new hip.  Do not put on socks or shoes without following the instructions of your caregivers.  Sit on high chairs so your hips are not bent more than 90 degrees.  Sit on chairs with arms. Use the chair arms to help push yourself up when arising.  Keep your leg on the side of the operation out in front of you when standing up.  Arrange for the use of a toilet seat elevator so you are not sitting low.  Do not do any exercises or get in any positions that cause your toes to point in (pigeon toed).  Always sleep with a pillow between your legs. Do not  lie on your side in sleep with both knees touching the bed.  You may resume a sexual relationship in one month or when given the OK by your caregiver.  Use crutches or walker as long as suggested by your caregivers.  Begin weight bearing with your caregiver's approval.  Avoid periods of inactivity such as sitting longer than an hour when not asleep. This helps prevent blood clots.  Return to work as instructed by your caregiver.  Do not drive a car for 6 weeks or as instructed.  Do not drive while taking narcotics.  Wear elastic stockings until instructed not to.  Make sure you keep all of your appointments after your operation with all of your doctors and caregivers. RANGE OF MOTION AND STRENGTHENING EXERCISES These exercises are designed to help you keep full movement of your hip joint. Follow your caregiver's or  physical therapist's instructions. Perform all exercises about fifteen times, three times per day or as directed. Exercise both hips, even if you have had only one joint replacement. These exercises can be done on a training (exercise) mat, on the floor, on a table or on a bed. Use whatever works the best and is most comfortable for you. Use music or television while you are exercising so that the exercises are a pleasant break in your day. This will make your life better with the exercises acting as a break in routine you can look forward to.  Lying on your back, slowly slide your foot toward your buttocks, raising your knee up off the floor. Then slowly slide your foot back down until your leg is straight again.  Lying on your back spread your legs as far apart as you can without causing discomfort.  Lying on your side, raise your upper leg and foot straight up from the floor as far as is comfortable. Slowly lower the leg and repeat.  Lying on your back, tighten up the muscle in the front of your thigh (quadriceps muscles). You can do this by keeping your leg straight and trying to raise your heel off the floor. This helps strengthen the largest muscle supporting your knee.  Lying on your back, tighten up the muscles of your buttocks both with the legs straight and with the knee bent at a comfortable angle while keeping your heel on the floor.  Lying on your stomach, lift your toes off the floor towards your buttocks. Bend your knee as far as is comfortable. Tighten the muscles in the buttocks while doing this. Document Released: 12/20/2003 Document Revised: 08/10/2011 Document Reviewed: 12/07/2007 [facility] Patient Information 2015 [place], [place]. This information is not intended to replace advice given to you by your health care provider. Make sure you discuss any questions you have with your health care provider.  Hip Rehabilitation, Guidelines Following Surgery The results of a hip operation  are greatly improved after range of motion and muscle strengthening exercises. Follow all safety measures which are given to protect your hip. If any of these exercises cause increased pain or swelling in your joint, decrease the amount until you are comfortable again. Then slowly increase the exercises. Call your caregiver if you have problems or questions. HOME CARE INSTRUCTIONS  Most of the following instructions are designed to prevent the dislocation of your new hip.  Do not put on socks or shoes without following the instructions of your caregivers.  Sit on high chairs so your hips are not bent more than 90 degrees.  Sit on chairs with  arms. Use the chair arms to help push yourself up when arising.  Keep your leg on the side of the operation out in front of you when standing up.  Arrange for the use of a toilet seat elevator so you are not sitting low.  Do not do any exercises or get in any positions that cause your toes to point in (pigeon toed).  Always sleep with a pillow between your legs. Do not lie on your side in sleep with both knees touching the bed.  You may resume a sexual relationship in one month or when given the OK by your caregiver.  Use crutches or walker as long as suggested by your caregivers.  Begin weight bearing with your caregiver's approval.  Avoid periods of inactivity such as sitting longer than an hour when not asleep. This helps prevent blood clots.  Return to work as instructed by your caregiver.  Do not drive a car for 6 weeks or as instructed.  Do not drive while taking narcotics.  Wear elastic stockings until instructed not to.  Make sure you keep all of your appointments after your operation with all of your doctors and caregivers. RANGE OF MOTION AND STRENGTHENING EXERCISES These exercises are designed to help you keep full movement of your hip joint. Follow your caregiver's or physical therapist's instructions. Perform all exercises about  fifteen times, three times per day or as directed. Exercise both hips, even if you have had only one joint replacement. These exercises can be done on a training (exercise) mat, on the floor, on a table or on a bed. Use whatever works the best and is most comfortable for you. Use music or television while you are exercising so that the exercises are a pleasant break in your day. This will make your life better with the exercises acting as a break in routine you can look forward to.  Lying on your back, slowly slide your foot toward your buttocks, raising your knee up off the floor. Then slowly slide your foot back down until your leg is straight again.  Lying on your back spread your legs as far apart as you can without causing discomfort.  Lying on your side, raise your upper leg and foot straight up from the floor as far as is comfortable. Slowly lower the leg and repeat.  Lying on your back, tighten up the muscle in the front of your thigh (quadriceps muscles). You can do this by keeping your leg straight and trying to raise your heel off the floor. This helps strengthen the largest muscle supporting your knee.  Lying on your back, tighten up the muscles of your buttocks both with the legs straight and with the knee bent at a comfortable angle while keeping your heel on the floor.  Lying on your stomach, lift your toes off the floor towards your buttocks. Bend your knee as far as is comfortable. Tighten the muscles in the buttocks while doing this. Document Released: 12/20/2003 Document Revised: 08/10/2011 Document Reviewed: 12/07/2007 [facility] Patient Information 2015 [place], [place]. This information is not intended to replace advice given to you by your health care provider. Make sure you discuss any questions you have with your health care provider.  Total Hip Replacement Care After Refer to this sheet in the next few weeks. These instructions provide you with information on caring for  yourself after your procedure. Your caregiver also may give you specific instructions. Your treatment has been planned according to the most current  medical practices, but problems sometimes occur. Call your caregiver if you have any problems or questions after your procedure. HOME CARE INSTRUCTIONS  Your caregiver will give you specific precautions for certain types of movement. Additional instructions include:  Take over-the-counter or prescription medicines for pain, discomfort, or fever only as directed by your caregiver.  Take quick showers (3-5 min) rather than bathe until your caregiver tells you that you can take baths again.  Avoid lifting until your caregiver instructs you otherwise.  Use a raised toilet seat and avoid sitting in low chairs as instructed by your caregiver.  Use crutches or a walker as instructed by your caregiver. SEEK MEDICAL CARE IF:  You have difficulty breathing.  Your wound is red, swollen, or has become increasingly painful.  You have pus draining from your wound.  You have a bad smell coming from your wound.  You have persistent bleeding from your wound.  Your wound breaks open after sutures (stitches) or staples have been removed. SEEK IMMEDIATE MEDICAL CARE IF:   You have a fever.  You have a rash.  You have pain or swelling in your calf or thigh.  You have shortness of breath or chest pain. MAKE SURE YOU:  Understand these instructions.  Will watch your condition.  Will get help right away if you are not doing well or get worse. Document Released: 12/05/2004 Document Revised: 11/17/2011 Document Reviewed: 07/05/2011 [facility] Patient Information 2015 [place], [place]. This information is not intended to replace advice given to you by your health care provider. Make sure you discuss any questions you have with your health care provider.

## 2013-12-17 NOTE — Progress Notes (Signed)
Subjective: 2 Days Post-Op Procedure(s) (LRB): RIGHT TOTAL HIP ARTHROPLASTY ANTERIOR APPROACH (Right) Patient reports pain as mild.    Objective: Vital signs in last 24 hours: Temp:  [98.6 F (37 C)-99.5 F (37.5 C)] 98.6 F (37 C) (07/19 0657) Pulse Rate:  [72-80] 79 (07/19 0657) Resp:  [16-18] 16 (07/19 0657) BP: (100-128)/(54-72) 128/72 mmHg (07/19 0657) SpO2:  [95 %-98 %] 95 % (07/19 0657)  Intake/Output from previous day: 07/18 0701 - 07/19 0700 In: 1200 [P.O.:1200] Out: 1050 [Urine:1050] Intake/Output this shift:     Recent Labs  12/16/13 0530 12/17/13 0520  HGB 12.0* 11.9*    Recent Labs  12/16/13 0530 12/17/13 0520  WBC 7.5 9.6  RBC 3.74* 3.76*  HCT 34.2* 34.2*  PLT 149* 131*    Recent Labs  12/16/13 0530  NA 135*  K 3.6*  CL 101  CO2 24  BUN 14  CREATININE 0.92  GLUCOSE 111*  CALCIUM 8.0*   No results found for this basename: LABPT, INR,  in the last 72 hours  Neurologically intact  Assessment/Plan: 2 Days Post-Op Procedure(s) (LRB): RIGHT TOTAL HIP ARTHROPLASTY ANTERIOR APPROACH (Right) Plan  Discharge home.   YATES,MARK C 12/17/2013, 8:57 AM

## 2013-12-18 NOTE — Discharge Summary (Signed)
Patient ID: Jeremy Norman MRN: [id number] DOB/AGE: [birth year] 63 y.o.  Admit date: 12/15/2013 Discharge date: 12/18/2013  Admission Diagnoses:  Principal Problem:   Arthritis of right hip Active Problems:   Status post THR (total hip replacement)   Discharge Diagnoses:  Same  Past Medical History  Diagnosis Date  . GERD (gastroesophageal reflux disease)     occasional   . Osteoarthritis   . Acute shoulder pain 12/02/13    right shoulder-fell on shoulder on 12/02/13    Surgeries: Procedure(s): RIGHT TOTAL HIP ARTHROPLASTY ANTERIOR APPROACH on 12/15/2013   Consultants:    Discharged Condition: Improved  Hospital Course: Jeremy Norman is an 63 y.o. male who was admitted 12/15/2013 for operative treatment ofArthritis of right hip. Patient has severe unremitting pain that affects sleep, daily activities, and work/hobbies. After pre-op clearance the patient was taken to the operating room on 12/15/2013 and underwent  Procedure(s): RIGHT TOTAL HIP ARTHROPLASTY ANTERIOR APPROACH.    Patient was given perioperative antibiotics: Anti-infectives   Start     Dose/Rate Route Frequency Ordered Stop   12/15/13 1800  ceFAZolin (ANCEF) IVPB 1 g/50 mL premix     1 g 100 mL/hr over 30 Minutes Intravenous Every 6 hours 12/15/13 1555 12/16/13 0016   12/15/13 1003  ceFAZolin (ANCEF) IVPB 2 g/50 mL premix     2 g 100 mL/hr over 30 Minutes Intravenous On call to O.R. 12/15/13 1003 12/15/13 1250       Patient was given sequential compression devices, early ambulation, and chemoprophylaxis to prevent DVT.  Patient benefited maximally from hospital stay and there were no complications.    Recent vital signs: No data found.    Recent laboratory studies:  Recent Labs  12/16/13 0530 12/17/13 0520  WBC 7.5 9.6  HGB 12.0* 11.9*  HCT 34.2* 34.2*  PLT 149* 131*  NA 135*  --   K 3.6*  --   CL 101  --   CO2 24  --   BUN 14  --   CREATININE 0.92  --   GLUCOSE 111*  --   CALCIUM 8.0*  --       Discharge Medications:     Medication List    STOP taking these medications       ibuprofen 200 MG tablet  Commonly known as:  ADVIL,MOTRIN     naproxen sodium 220 MG tablet  Commonly known as:  ANAPROX      TAKE these medications       aspirin 325 MG EC tablet  Take 1 tablet (325 mg total) by mouth 2 (two) times daily after a meal.     HYDROcodone-acetaminophen 5-325 MG per tablet  Commonly known as:  NORCO  Take 1-2 tablets by mouth every 4 (four) hours as needed for moderate pain.     methocarbamol 500 MG tablet  Commonly known as:  ROBAXIN  Take 1 tablet (500 mg total) by mouth every 6 (six) hours as needed for muscle spasms.     ondansetron 4 MG disintegrating tablet  Commonly known as:  ZOFRAN ODT  Take 1 tablet (4 mg total) by mouth every 8 (eight) hours as needed for nausea or vomiting.     sildenafil 100 MG tablet  Commonly known as:  VIAGRA  Take 50 mg by mouth daily as needed for erectile dysfunction.     zolpidem 10 MG tablet  Commonly known as:  AMBIEN  Take 5 mg by mouth at bedtime as needed for sleep.  Diagnostic Studies: Dg Hip Complete Right  12/15/2013   CLINICAL DATA:  Right total hip arthroplasty  EXAM: DG C-ARM 1-60 MIN - [facility]; RIGHT HIP - COMPLETE 2+ VIEW  COMPARISON:  None.  FINDINGS: Two intraoperative fluoroscopic images demonstrate evidence of right total hip arthroplasty without evidence for hardware failure or fracture.  IMPRESSION: Expected intraoperative appearance, right total hip arthroplasty.   Electronically Signed   By: Conchita Paris M.D.   On: 12/15/2013 14:21   Dg Pelvis Portable  12/15/2013   CLINICAL DATA:  63 year old male status post right hip arthroplasty. Initial encounter.  EXAM: PORTABLE PELVIS 1-2 VIEWS  COMPARISON:  Intraoperative films from today at 1258 hr.  FINDINGS: Portable AP view at 1457 hrs. Right total hip arthroplasty in place. Hardware components appear intact and normally aligned on this AP  view. Postoperative changes to the surrounding soft tissues. Other osseous structures appear intact.  IMPRESSION: Right total hip arthroplasty with no adverse features.   Electronically Signed   By: Lars Pinks M.D.   On: 12/15/2013 15:32   Dg Hip Portable 1 View Right  12/15/2013   CLINICAL DATA:  63 year old male status post right hip replacement. Initial encounter.  EXAM: PORTABLE RIGHT HIP - 1 VIEW  COMPARISON:  1457 hr today and earlier.  FINDINGS: Portable cross-table lateral view of the right hip at 1457 hrs. Right total hip arthroplasty with intact and normally aligned components.  IMPRESSION: Right total hip arthroplasty, normally aligned.   Electronically Signed   By: Lars Pinks M.D.   On: 12/15/2013 15:32   Dg C-arm 1-60 Min-no Report  12/15/2013   CLINICAL DATA:  Right total hip arthroplasty  EXAM: DG C-ARM 1-60 MIN - [facility]; RIGHT HIP - COMPLETE 2+ VIEW  COMPARISON:  None.  FINDINGS: Two intraoperative fluoroscopic images demonstrate evidence of right total hip arthroplasty without evidence for hardware failure or fracture.  IMPRESSION: Expected intraoperative appearance, right total hip arthroplasty.   Electronically Signed   By: Conchita Paris M.D.   On: 12/15/2013 14:21    Disposition: 01-Home or Self Care      Discharge Instructions   Call MD / Call 911    Complete by:  As directed   If you experience chest pain or shortness of breath, CALL 911 and be transported to the hospital emergency room.  If you develope a fever above 101 F, pus (white drainage) or increased drainage or redness at the wound, or calf pain, call your surgeon's office.     Constipation Prevention    Complete by:  As directed   Drink plenty of fluids.  Prune juice may be helpful.  You may use a stool softener, such as Colace (over the counter) 100 mg twice a day.  Use MiraLax (over the counter) for constipation as needed.     Diet - low sodium heart healthy    Complete by:  As directed      Discharge  instructions    Complete by:  As directed   Increase your activities as comfort allows. Do get an over-the-counter stool softener to take daily as needed. You can get your current dressing wet daily in the shower. You can remove your current dressing next Friday and start getting your actual incision wet daily. New dry dressing daily in 6 days.     Increase activity slowly as tolerated    Complete by:  As directed            Follow-up Information  Follow up with Mcarthur Rossetti, MD In 2 weeks.   Specialty:  Orthopedic Surgery   Contact information:   [place] [address] [phone]       Follow up with [facility])    Contact information:   [address] [place] [address] [phone]       Follow up with Mcarthur Rossetti, MD In 1 week.   Specialty:  Orthopedic Surgery   Contact information:   [place] [address] [phone]        Signed: Mcarthur Rossetti 12/18/2013, 7:08 AM

## 2013-12-19 ENCOUNTER — Encounter (HOSPITAL_COMMUNITY): Payer: Self-pay | Admitting: Orthopaedic Surgery

## 2014-05-22 ENCOUNTER — Ambulatory Visit: Payer: Medicare Other | Attending: Orthopaedic Surgery

## 2014-05-22 DIAGNOSIS — M25511 Pain in right shoulder: Secondary | ICD-10-CM | POA: Diagnosis not present

## 2014-06-06 ENCOUNTER — Ambulatory Visit: Payer: Medicare Other | Attending: Orthopaedic Surgery

## 2014-06-06 DIAGNOSIS — M25511 Pain in right shoulder: Secondary | ICD-10-CM | POA: Diagnosis not present

## 2014-06-08 ENCOUNTER — Ambulatory Visit: Payer: Medicare Other | Admitting: Physical Therapy

## 2014-06-08 DIAGNOSIS — M25511 Pain in right shoulder: Secondary | ICD-10-CM | POA: Diagnosis not present

## 2014-06-13 ENCOUNTER — Ambulatory Visit: Payer: Medicare Other

## 2014-06-13 DIAGNOSIS — M25511 Pain in right shoulder: Secondary | ICD-10-CM | POA: Diagnosis not present

## 2014-06-14 ENCOUNTER — Ambulatory Visit: Payer: Medicare Other

## 2014-06-14 DIAGNOSIS — M25511 Pain in right shoulder: Secondary | ICD-10-CM | POA: Diagnosis not present

## 2014-06-18 ENCOUNTER — Ambulatory Visit: Payer: Medicare Other

## 2014-06-18 DIAGNOSIS — M25511 Pain in right shoulder: Secondary | ICD-10-CM | POA: Diagnosis not present

## 2014-06-21 ENCOUNTER — Ambulatory Visit: Payer: Medicare Other | Admitting: Rehabilitation

## 2014-06-21 DIAGNOSIS — M25511 Pain in right shoulder: Secondary | ICD-10-CM | POA: Diagnosis not present

## 2014-06-25 ENCOUNTER — Ambulatory Visit: Payer: Medicare Other

## 2014-06-27 DIAGNOSIS — M25511 Pain in right shoulder: Secondary | ICD-10-CM | POA: Diagnosis not present

## 2014-08-10 DIAGNOSIS — Z0000 Encounter for general adult medical examination without abnormal findings: Secondary | ICD-10-CM | POA: Diagnosis not present

## 2014-08-10 DIAGNOSIS — J449 Chronic obstructive pulmonary disease, unspecified: Secondary | ICD-10-CM | POA: Diagnosis not present

## 2014-08-10 DIAGNOSIS — F528 Other sexual dysfunction not due to a substance or known physiological condition: Secondary | ICD-10-CM | POA: Diagnosis not present

## 2014-08-10 DIAGNOSIS — Z125 Encounter for screening for malignant neoplasm of prostate: Secondary | ICD-10-CM | POA: Diagnosis not present

## 2014-08-10 DIAGNOSIS — G4700 Insomnia, unspecified: Secondary | ICD-10-CM | POA: Diagnosis not present

## 2014-08-15 DIAGNOSIS — M25511 Pain in right shoulder: Secondary | ICD-10-CM | POA: Diagnosis not present

## 2014-08-15 DIAGNOSIS — Z96641 Presence of right artificial hip joint: Secondary | ICD-10-CM | POA: Diagnosis not present

## 2014-08-16 ENCOUNTER — Other Ambulatory Visit: Payer: Self-pay | Admitting: Orthopaedic Surgery

## 2014-08-16 DIAGNOSIS — M25511 Pain in right shoulder: Secondary | ICD-10-CM

## 2014-08-28 ENCOUNTER — Ambulatory Visit
Admission: RE | Admit: 2014-08-28 | Discharge: 2014-08-28 | Disposition: A | Discharge status: Home / Self Care | Payer: Medicare Other | Source: Ambulatory Visit | Attending: Orthopaedic Surgery | Admitting: Orthopaedic Surgery

## 2014-08-28 DIAGNOSIS — M25511 Pain in right shoulder: Secondary | ICD-10-CM

## 2014-08-28 DIAGNOSIS — M75101 Unspecified rotator cuff tear or rupture of right shoulder, not specified as traumatic: Secondary | ICD-10-CM | POA: Diagnosis not present

## 2014-08-28 DIAGNOSIS — M7591 Shoulder lesion, unspecified, right shoulder: Secondary | ICD-10-CM | POA: Diagnosis not present

## 2014-08-28 DIAGNOSIS — M19011 Primary osteoarthritis, right shoulder: Secondary | ICD-10-CM | POA: Diagnosis not present

## 2014-08-30 DIAGNOSIS — M25511 Pain in right shoulder: Secondary | ICD-10-CM | POA: Diagnosis not present

## 2014-09-27 DIAGNOSIS — M7541 Impingement syndrome of right shoulder: Secondary | ICD-10-CM | POA: Diagnosis not present

## 2014-09-27 DIAGNOSIS — M24111 Other articular cartilage disorders, right shoulder: Secondary | ICD-10-CM | POA: Diagnosis not present

## 2014-09-27 DIAGNOSIS — M75111 Incomplete rotator cuff tear or rupture of right shoulder, not specified as traumatic: Secondary | ICD-10-CM | POA: Diagnosis not present

## 2014-09-27 DIAGNOSIS — G8918 Other acute postprocedural pain: Secondary | ICD-10-CM | POA: Diagnosis not present

## 2014-09-27 DIAGNOSIS — M779 Enthesopathy, unspecified: Secondary | ICD-10-CM | POA: Diagnosis not present

## 2014-09-27 DIAGNOSIS — M7551 Bursitis of right shoulder: Secondary | ICD-10-CM | POA: Diagnosis not present

## 2015-09-02 IMAGING — RF DG HIP COMPLETE 2+V*R*
1 series · 2 of 2 positions shown · non-contrast
Comparison: None.

CLINICAL DATA: Right total hip arthroplasty

EXAM:
DG C-ARM 1-60 MIN - [facility]; RIGHT HIP - COMPLETE 2+ VIEW

[Series 1: run · 2 of 2 slices shown]
[im 1/2]
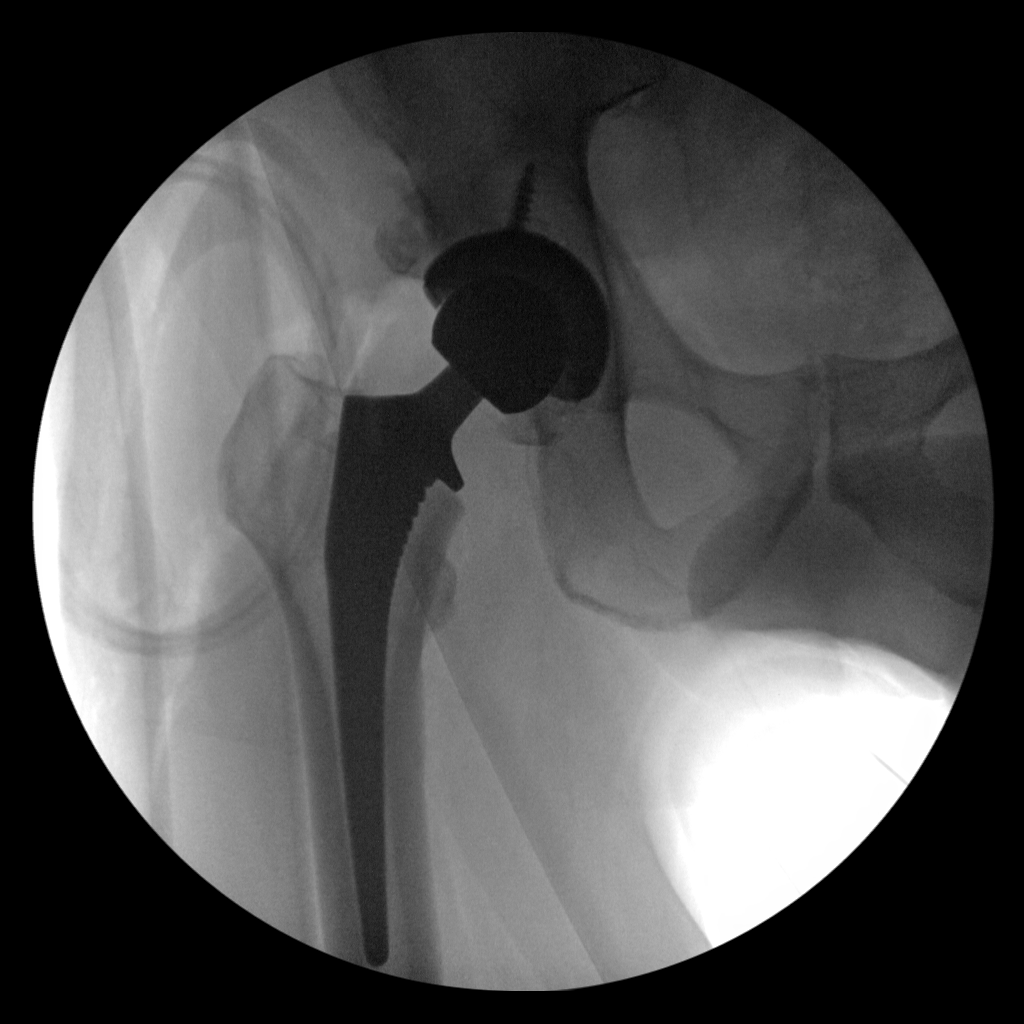
[im 2/2]
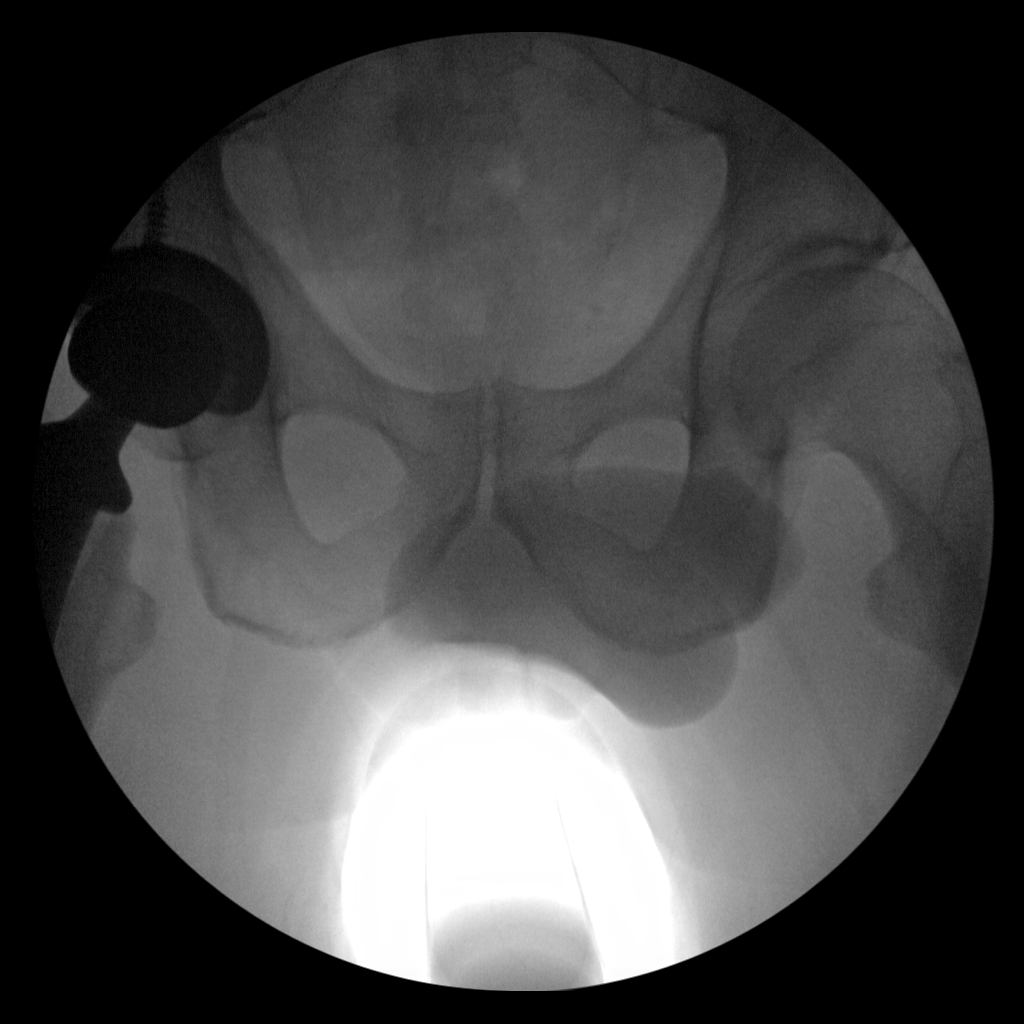

[2 of 2 positions shown; findings below may reference images not displayed]

FINDINGS: Two intraoperative fluoroscopic images demonstrate evidence of right
total hip arthroplasty without evidence for hardware failure or
fracture.
IMPRESSION: Expected intraoperative appearance, right total hip arthroplasty.

## 2015-09-02 IMAGING — DX DG PORTABLE PELVIS
1 series · 1 of 1 positions shown · non-contrast
Comparison: Intraoperative films from today at 0832 hr.

CLINICAL DATA: 63-year-old male status post right hip arthroplasty.
Initial encounter.

EXAM:
PORTABLE PELVIS 1-2 VIEWS

[pelvis ap]
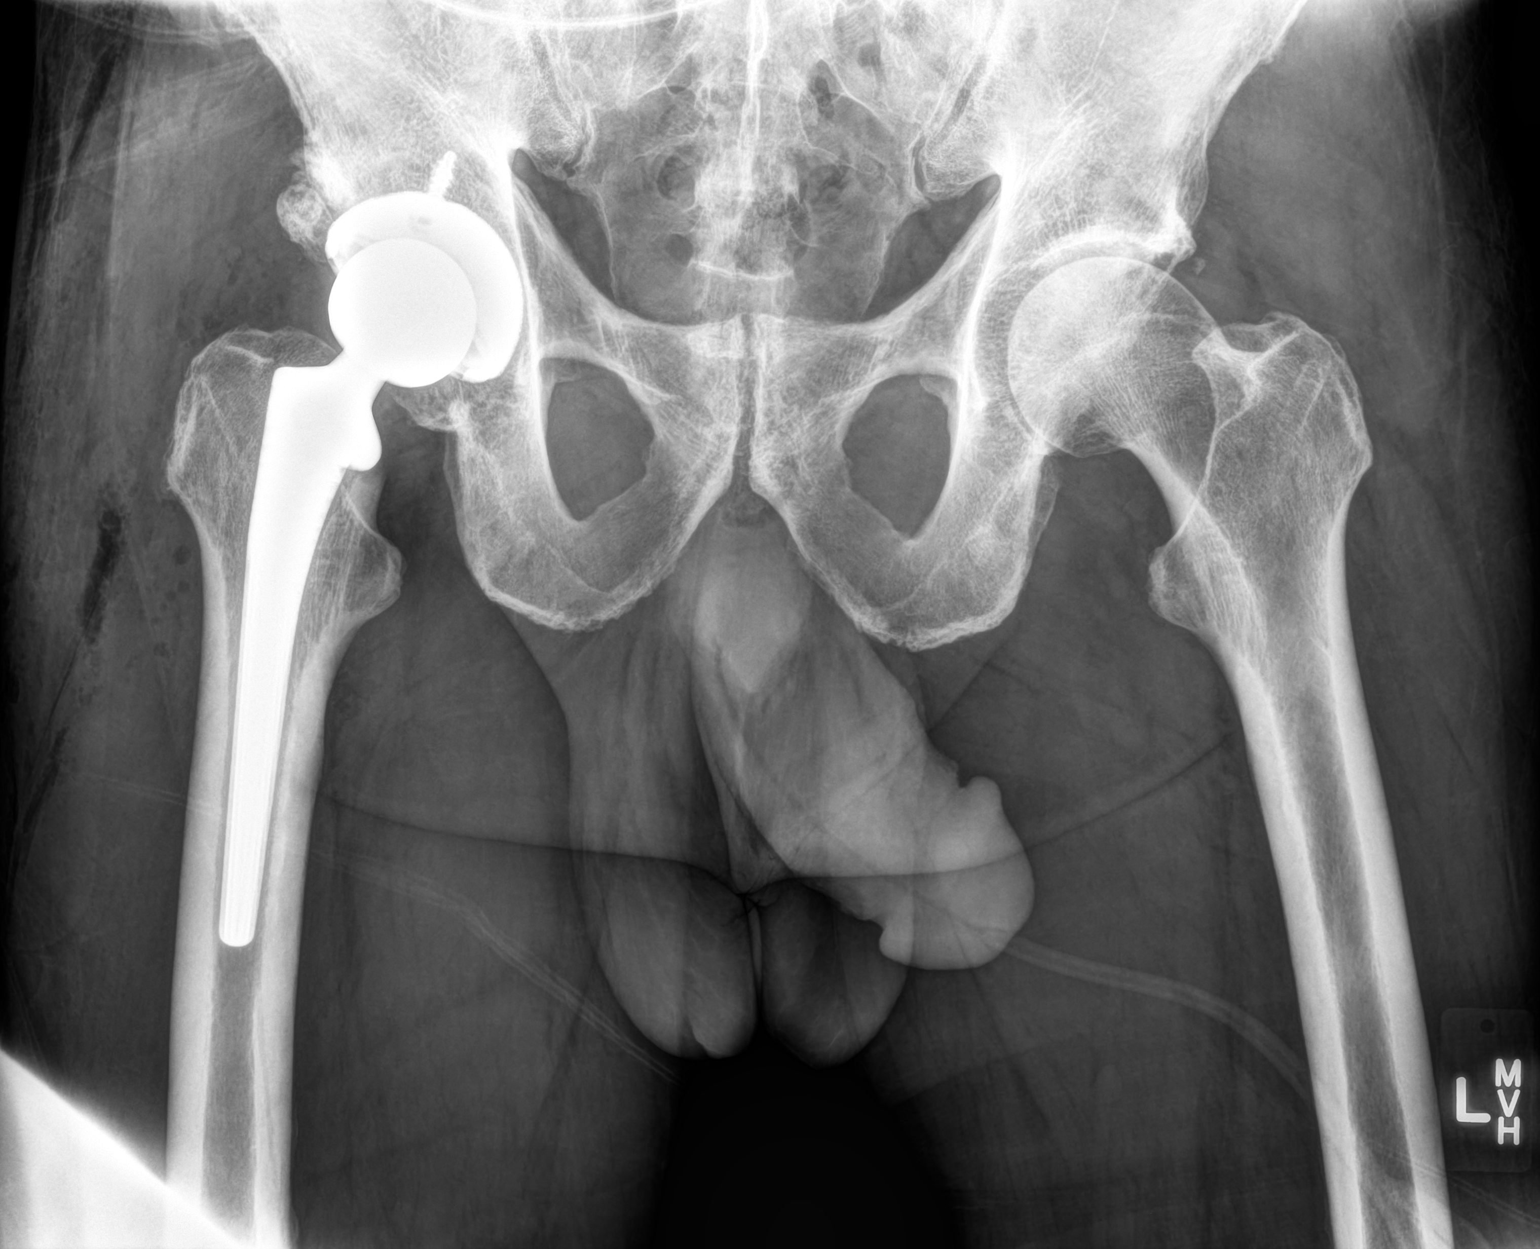

[1 of 1 positions shown; findings below may reference images not displayed]

FINDINGS: Portable AP view at 3525 hrs. Right total hip arthroplasty in place.
Hardware components appear intact and normally aligned on this AP
view. Postoperative changes to the surrounding soft tissues. Other
osseous structures appear intact.
IMPRESSION: Right total hip arthroplasty with no adverse features.

## 2015-10-22 DIAGNOSIS — S0501XA Injury of conjunctiva and corneal abrasion without foreign body, right eye, initial encounter: Secondary | ICD-10-CM | POA: Diagnosis not present

## 2016-05-26 ENCOUNTER — Emergency Department (INDEPENDENT_AMBULATORY_CARE_PROVIDER_SITE_OTHER)
Admission: EM | Admit: 2016-05-26 | Discharge: 2016-05-26 | Disposition: A | Discharge status: Home / Self Care | Payer: Medicare Other | Source: Home / Self Care | Attending: Family Medicine | Admitting: Family Medicine

## 2016-05-26 DIAGNOSIS — J209 Acute bronchitis, unspecified: Secondary | ICD-10-CM

## 2016-05-26 MED ORDER — PREDNISONE 20 MG PO TABS: ORAL_TABLET | ORAL | 0 refills | Status: DC

## 2016-05-26 MED ORDER — ALBUTEROL SULFATE HFA 108 (90 BASE) MCG/ACT IN AERS: 1.0000 | INHALATION_SPRAY | Freq: Four times a day (QID) | RESPIRATORY_TRACT | 0 refills | Status: DC | PRN

## 2016-05-26 MED ORDER — AMOXICILLIN-POT CLAVULANATE 875-125 MG PO TABS: 1.0000 | ORAL_TABLET | Freq: Two times a day (BID) | ORAL | 0 refills | Status: DC

## 2016-05-26 NOTE — ED Provider Notes (Signed)
CSN: [id number]     Arrival date & time 05/26/16  1758 History   First MD Initiated Contact with Patient 05/26/16 1837     Chief Complaint  Patient presents with  . Cough   (Consider location/radiation/quality/duration/timing/severity/associated sxs/prior Treatment) HPI Jeremy Norman is a 65 y.o. male presenting to UC with c/o 1 week of worsening productive cough with dark yellow sputum, chills, and fatigue with body aches.  He has tried Tylenol, OTC cough syrup, and cough drops with mild temporary relief.  He has been having generalized headaches but those have improved today. He notes he was around a co-worker last week who had similar symptoms, then had to miss a day of work due to being sick.  Pt did not get the flu vaccine this season. Denies chest pain or SOB.    Past Medical History:  Diagnosis Date  . Acute shoulder pain 12/02/13   right shoulder-fell on shoulder on 12/02/13  . GERD (gastroesophageal reflux disease)    occasional   . Osteoarthritis    Past Surgical History:  Procedure Laterality Date  . REFRACTIVE SURGERY Bilateral 15 years ago  . TOTAL HIP ARTHROPLASTY Right 12/15/2013   Procedure: RIGHT TOTAL HIP ARTHROPLASTY ANTERIOR APPROACH;  Surgeon: Mcarthur Rossetti, MD;  Location: [facility];  Service: Orthopedics;  Laterality: Right;  . TOTAL KNEE ARTHROPLASTY Left 2002   History reviewed. No pertinent family history. Social History  Substance Use Topics  . Smoking status: Former Smoker    Packs/day: 2.00    Types: Cigarettes  . Smokeless tobacco: Never Used     Comment: quit smoking in mid 1980s  . Alcohol use Yes     Comment: 1 daily    Review of Systems  Constitutional: Positive for appetite change, chills and fatigue. Negative for fever.  HENT: Positive for congestion, postnasal drip, rhinorrhea and sore throat. Negative for ear pain, trouble swallowing and voice change.   Respiratory: Positive for cough. Negative for shortness of breath.    Cardiovascular: Negative for chest pain and palpitations.  Gastrointestinal: Negative for abdominal pain, diarrhea, nausea and vomiting.  Musculoskeletal: Negative for arthralgias, back pain and myalgias.  Skin: Negative for rash.  Neurological: Positive for headaches. Negative for dizziness and light-headedness.    Allergies  Morphine and related  Home Medications   Prior to Admission medications   Medication Sig Start Date End Date Taking? Authorizing Provider  albuterol (PROVENTIL HFA;VENTOLIN HFA) 108 (90 Base) MCG/ACT inhaler Inhale 1-2 puffs into the lungs every 6 (six) hours as needed for wheezing or shortness of breath. 05/26/16   Noland Fordyce, PA-C  amoxicillin-clavulanate (AUGMENTIN) 875-125 MG tablet Take 1 tablet by mouth 2 (two) times daily. One po bid x 7 days 05/26/16   Noland Fordyce, PA-C  aspirin EC 325 MG EC tablet Take 1 tablet (325 mg total) by mouth 2 (two) times daily after a meal. 12/16/13   Mcarthur Rossetti, MD  HYDROcodone-acetaminophen (NORCO) 5-325 MG per tablet Take 1-2 tablets by mouth every 4 (four) hours as needed for moderate pain. 12/16/13   Mcarthur Rossetti, MD  methocarbamol (ROBAXIN) 500 MG tablet Take 1 tablet (500 mg total) by mouth every 6 (six) hours as needed for muscle spasms. 12/16/13   Mcarthur Rossetti, MD  ondansetron (ZOFRAN ODT) 4 MG disintegrating tablet Take 1 tablet (4 mg total) by mouth every 8 (eight) hours as needed for nausea or vomiting. 12/16/13   Mcarthur Rossetti, MD  predniSONE (DELTASONE) 20 MG tablet  3 tabs po day one, then 2 po daily x 4 days 05/26/16   Noland Fordyce, PA-C  sildenafil (VIAGRA) 100 MG tablet Take 50 mg by mouth daily as needed for erectile dysfunction.    Historical Provider, MD  zolpidem (AMBIEN) 10 MG tablet Take 5 mg by mouth at bedtime as needed for sleep.    Historical Provider, MD   Meds Ordered and Administered this Visit  Medications - No data to display  BP 112/63 (BP Location: Left  Arm)   Pulse 79   Temp 98.4 F (36.9 C) (Oral)   SpO2 94%  No data found.   Physical Exam  Constitutional: He appears well-developed and well-nourished. No distress.  HENT:  Head: Normocephalic and atraumatic.  Right Ear: Tympanic membrane normal.  Left Ear: Tympanic membrane normal.  Nose: Mucosal edema present.  Mouth/Throat: Uvula is midline, oropharynx is clear and moist and mucous membranes are normal.  Eyes: Conjunctivae are normal. No scleral icterus.  Neck: Normal range of motion. Neck supple.  Cardiovascular: Normal rate, regular rhythm and normal heart sounds.   Pulmonary/Chest: Effort normal. No stridor. No respiratory distress. He has wheezes. He has rhonchi. He has no rales.  Diffuse wheeze and rhonchi.  Musculoskeletal: Normal range of motion.  Lymphadenopathy:    He has no cervical adenopathy.  Neurological: He is alert.  Skin: Skin is warm and dry. He is not diaphoretic.  Nursing note and vitals reviewed.   Urgent Care Course   Clinical Course     Procedures (including critical care time)  Labs Review Labs Reviewed - No data to display  Imaging Review No results found.    MDM   1. Acute bronchitis, unspecified organism    Pt c/o flu-like symptoms for 1 week.  Diffuse wheeze and rhonchi noted on exam. Will cover for bacterial cause due to worsening symptoms. Rx: albuterol inhaler, prednisone, and Augmentin Home care instructions provided. F/u with PCP in 1 week if not improving. Patient verbalized understanding and agreement with treatment plan.    Noland Fordyce, PA-C 05/26/16 1920

## 2016-05-26 NOTE — ED Triage Notes (Signed)
Patient complains of productive cough (dark yellow) in color. States has had chills but does not think he has had a fever, very fatigued. States that he has tried Tylenol, OTC cough syrup , cough drops. States that he was having headaches but better today.

## 2016-05-26 NOTE — Discharge Instructions (Signed)
°  You have been prescribed 5 days of prednisone, an oral steroid to help with inflammation in your airways to help decrease cough and shortness of breath. You may start this medication tomorrow with breakfast as it can make it difficult to sleep if taken at night.

## 2016-08-11 ENCOUNTER — Ambulatory Visit (INDEPENDENT_AMBULATORY_CARE_PROVIDER_SITE_OTHER): Payer: Medicare Other | Admitting: Podiatry

## 2016-08-11 ENCOUNTER — Encounter: Payer: Self-pay | Admitting: Podiatry

## 2016-08-11 VITALS — Ht 68.0 in | Wt 225.0 lb

## 2016-08-11 DIAGNOSIS — M79672 Pain in left foot: Secondary | ICD-10-CM | POA: Diagnosis not present

## 2016-08-11 DIAGNOSIS — B353 Tinea pedis: Secondary | ICD-10-CM | POA: Diagnosis not present

## 2016-08-11 DIAGNOSIS — M79671 Pain in right foot: Secondary | ICD-10-CM

## 2016-08-11 DIAGNOSIS — B351 Tinea unguium: Secondary | ICD-10-CM | POA: Diagnosis not present

## 2016-08-11 NOTE — Patient Instructions (Signed)
Seen for hypertrophic nails. All nails debrided. May use medicated shampoo scrub after soak for dry scaly skin. Return in 3 months or as needed.

## 2016-08-11 NOTE — Progress Notes (Signed)
SUBJECTIVE: 66 y.o. year old male presents with painful thick toe nails. They are painful in shoes to walk.   REVIEW OF SYSTEMS: Pertinent items noted in HPI and remainder of comprehensive ROS otherwise negative.  OBJECTIVE: DERMATOLOGIC EXAMINATION: Thick dystrophic nails x 10 with severe deformity on right great toe nail. Dry scaly skin with callus under left great toe.  VASCULAR EXAMINATION OF LOWER LIMBS: All pedal pulses are palpable with normal pulsation.  Capillary Filling times within 3 seconds in all digits.  No edema or erythema noted. Temperature gradient from tibial crest to dorsum of foot is within normal bilateral.  NEUROLOGIC EXAMINATION OF THE LOWER LIMBS: All epicritic and tactile sensations grossly intact.  MUSCULOSKELETAL EXAMINATION: No gross deformities.  ASSESSMENT: Onychomycosis x 10. Tinea pedis bilateral. Pain in both feet.  PLAN: Reviewed clinical findings and available treatment options. All nails debrided. Reviewed soaking and scrubbing of dry scaly skin on both feet. Return in 3 month.

## 2016-08-12 ENCOUNTER — Telehealth: Payer: Self-pay | Admitting: *Deleted

## 2016-08-12 NOTE — Telephone Encounter (Signed)
08/12/16 Dr.Sheard, Pts. Wife called this am and said where pts foot was shaved on ball and heel of foot is very painful today and causing a lot of discomfort, He is putting antibiotic on it and a bandaid, if you have any other suggestions please let them know, Patient wanted you to be aware.

## 2016-11-10 ENCOUNTER — Encounter: Payer: Self-pay | Admitting: Podiatry

## 2016-11-10 ENCOUNTER — Ambulatory Visit (INDEPENDENT_AMBULATORY_CARE_PROVIDER_SITE_OTHER): Payer: Medicare Other | Admitting: Podiatry

## 2016-11-10 VITALS — BP 113/64 | HR 55

## 2016-11-10 DIAGNOSIS — M79671 Pain in right foot: Secondary | ICD-10-CM

## 2016-11-10 DIAGNOSIS — M79672 Pain in left foot: Secondary | ICD-10-CM

## 2016-11-10 DIAGNOSIS — B351 Tinea unguium: Secondary | ICD-10-CM

## 2016-11-10 NOTE — Progress Notes (Signed)
SUBJECTIVE: 66 y.o. year old male presents with painful thick toe nails. They are painful in shoes to walk. He is doing self care for painful calluses.Usi.ng OTC antifungal medication.  Denies any new problems  OBJECTIVE: DERMATOLOGIC EXAMINATION: Thick dystrophic nails x 10 with severe deformity on right great toe nail.  VASCULAR EXAMINATION OF LOWER LIMBS: All pedal pulses are palpable with normal pulsation.  Capillary Filling times within 3 seconds in all digits.  No edema or erythema noted. Temperature gradient from tibial crest to dorsum of foot is within normal bilateral.  NEUROLOGIC EXAMINATION OF THE LOWER LIMBS: All epicritic and tactile sensations grossly intact.  MUSCULOSKELETAL EXAMINATION: No gross deformities.  ASSESSMENT: Onychomycosis x 10. Pain in both feet.  PLAN: Reviewed clinical findings and available treatment options. All nails debrided and grinded. Return in 3 month.

## 2016-11-10 NOTE — Patient Instructions (Signed)
Seen for hypertrophic nails. All nails debrided. Return in 3 months or as needed.  

## 2016-12-15 ENCOUNTER — Encounter: Payer: Self-pay | Admitting: Podiatry

## 2016-12-15 ENCOUNTER — Ambulatory Visit (INDEPENDENT_AMBULATORY_CARE_PROVIDER_SITE_OTHER): Payer: Medicare Other | Admitting: Podiatry

## 2016-12-15 DIAGNOSIS — L97521 Non-pressure chronic ulcer of other part of left foot limited to breakdown of skin: Secondary | ICD-10-CM | POA: Diagnosis not present

## 2016-12-15 DIAGNOSIS — L02612 Cutaneous abscess of left foot: Secondary | ICD-10-CM

## 2016-12-15 DIAGNOSIS — M7752 Other enthesopathy of left foot: Secondary | ICD-10-CM

## 2016-12-15 DIAGNOSIS — L03032 Cellulitis of left toe: Secondary | ICD-10-CM

## 2016-12-15 NOTE — Progress Notes (Signed)
SUBJECTIVE: 66 y.o.year old malepresents with painful and red toe 5th left. It started to bother him really 4-5 days ago. Has had problems in past during summer month. Toe gets sweaty and peels.   OBJECTIVE: DERMATOLOGIC EXAMINATION: Inflamed and ulcerated digital corn 4th and 5th digit that has drained left. No active drainage noted.  VASCULAR EXAMINATION OF LOWER LIMBS: All pedal pulses are palpable with normal pulsation.  Capillary Filling times within 3 seconds in all digits.  No edema or erythema noted. Temperature gradient from tibial crest to dorsum of foot is within normal bilateral.  NEUROLOGIC EXAMINATION OF THE LOWER LIMBS: All epicritic and tactile sensations grossly intact.  MUSCULOSKELETAL EXAMINATION: Bone spur 5th toe left distal medial.   ASSESSMENT: Ulcerated and drained lesion corn 5th toe left. Cellulitis localized in 5th toe left. Pain in both feet.  PLAN: Reviewed clinical findings and available treatment options. Wound debrided and cleansed with Iodine. Aperture pad placed with home care instruction. Supply dispensed. Return in 2 weeks.

## 2016-12-15 NOTE — Patient Instructions (Signed)
Seen for red swollen 5th toe left. Noted of ulcerated corn. Lesion debrided and cleansed with Iodine solution. Home care instruction with supply dispensed. Return in 2 weeks.

## 2016-12-29 ENCOUNTER — Encounter: Payer: Self-pay | Admitting: Podiatry

## 2016-12-29 ENCOUNTER — Ambulatory Visit (INDEPENDENT_AMBULATORY_CARE_PROVIDER_SITE_OTHER): Payer: Medicare Other | Admitting: Podiatry

## 2016-12-29 DIAGNOSIS — L97521 Non-pressure chronic ulcer of other part of left foot limited to breakdown of skin: Secondary | ICD-10-CM

## 2016-12-29 DIAGNOSIS — M7752 Other enthesopathy of left foot: Secondary | ICD-10-CM

## 2016-12-29 NOTE — Progress Notes (Signed)
SUBJECTIVE: 66 y.o.year old malepresents for follow up on ulcerated 5th toe left.  OBJECTIVE: DERMATOLOGIC EXAMINATION: Inflamed and ulcerated digital corn 4th and 5th digit that has subsided with dry base. No build up noted.  VASCULAR EXAMINATION OF LOWER LIMBS: All pedal pulses are palpable with normal pulsation.  Capillary Filling times within 3 seconds in all digits.  White discolored skin at 4th web space bilateral. Temperature gradient from tibial crest to dorsum of foot is within normal bilateral.  NEUROLOGIC EXAMINATION OF THE LOWER LIMBS: All epicritic and tactile sensations grossly intact.  MUSCULOSKELETAL EXAMINATION: Bone spur 5th toe left distal medial.   ASSESSMENT: Ulcerated corn 5th toe left has healed. Tinea pedis bilateral 4th web space. Pain in both feet.  PLAN: Reviewed clinical findings and available treatment options. Aperture pad placed with home care instruction.  Return in 4 weeks or sooner if needed.

## 2016-12-29 NOTE — Patient Instructions (Signed)
Follow up on left 5th toe lesion. Healing well.  Continue with toe separator during the day. Return in 4 weeks.

## 2017-01-05 ENCOUNTER — Emergency Department (HOSPITAL_COMMUNITY): Payer: Medicare Other | Admitting: Anesthesiology

## 2017-01-05 ENCOUNTER — Inpatient Hospital Stay (HOSPITAL_BASED_OUTPATIENT_CLINIC_OR_DEPARTMENT_OTHER)
Admission: EM | Admit: 2017-01-05 | Discharge: 2017-01-07 | DRG: 340 | Disposition: A | Discharge status: Home / Self Care | Payer: Medicare Other | Attending: General Surgery | Admitting: General Surgery

## 2017-01-05 ENCOUNTER — Encounter (HOSPITAL_BASED_OUTPATIENT_CLINIC_OR_DEPARTMENT_OTHER): Payer: Self-pay

## 2017-01-05 ENCOUNTER — Encounter (HOSPITAL_COMMUNITY): Admission: EM | Disposition: A | Discharge status: Home / Self Care | Payer: Self-pay | Source: Home / Self Care

## 2017-01-05 ENCOUNTER — Emergency Department (HOSPITAL_BASED_OUTPATIENT_CLINIC_OR_DEPARTMENT_OTHER): Payer: Medicare Other

## 2017-01-05 DIAGNOSIS — L821 Other seborrheic keratosis: Secondary | ICD-10-CM | POA: Diagnosis not present

## 2017-01-05 DIAGNOSIS — D485 Neoplasm of uncertain behavior of skin: Secondary | ICD-10-CM | POA: Diagnosis not present

## 2017-01-05 DIAGNOSIS — Z87891 Personal history of nicotine dependence: Secondary | ICD-10-CM

## 2017-01-05 DIAGNOSIS — Z96641 Presence of right artificial hip joint: Secondary | ICD-10-CM | POA: Diagnosis not present

## 2017-01-05 DIAGNOSIS — K3580 Unspecified acute appendicitis: Secondary | ICD-10-CM | POA: Diagnosis not present

## 2017-01-05 DIAGNOSIS — D1721 Benign lipomatous neoplasm of skin and subcutaneous tissue of right arm: Secondary | ICD-10-CM | POA: Diagnosis not present

## 2017-01-05 DIAGNOSIS — K429 Umbilical hernia without obstruction or gangrene: Secondary | ICD-10-CM | POA: Diagnosis not present

## 2017-01-05 DIAGNOSIS — K353 Acute appendicitis with localized peritonitis, without perforation or gangrene: Secondary | ICD-10-CM

## 2017-01-05 DIAGNOSIS — Z96652 Presence of left artificial knee joint: Secondary | ICD-10-CM | POA: Diagnosis present

## 2017-01-05 DIAGNOSIS — Z885 Allergy status to narcotic agent status: Secondary | ICD-10-CM | POA: Diagnosis not present

## 2017-01-05 DIAGNOSIS — K219 Gastro-esophageal reflux disease without esophagitis: Secondary | ICD-10-CM | POA: Diagnosis not present

## 2017-01-05 DIAGNOSIS — N200 Calculus of kidney: Secondary | ICD-10-CM | POA: Diagnosis not present

## 2017-01-05 DIAGNOSIS — R112 Nausea with vomiting, unspecified: Secondary | ICD-10-CM | POA: Diagnosis not present

## 2017-01-05 DIAGNOSIS — D2262 Melanocytic nevi of left upper limb, including shoulder: Secondary | ICD-10-CM | POA: Diagnosis not present

## 2017-01-05 DIAGNOSIS — R1031 Right lower quadrant pain: Secondary | ICD-10-CM | POA: Diagnosis not present

## 2017-01-05 DIAGNOSIS — R1011 Right upper quadrant pain: Secondary | ICD-10-CM | POA: Diagnosis not present

## 2017-01-05 DIAGNOSIS — D729 Disorder of white blood cells, unspecified: Secondary | ICD-10-CM

## 2017-01-05 DIAGNOSIS — C4441 Basal cell carcinoma of skin of scalp and neck: Secondary | ICD-10-CM | POA: Diagnosis not present

## 2017-01-05 DIAGNOSIS — R110 Nausea: Secondary | ICD-10-CM | POA: Diagnosis not present

## 2017-01-05 DIAGNOSIS — M1990 Unspecified osteoarthritis, unspecified site: Secondary | ICD-10-CM | POA: Diagnosis not present

## 2017-01-05 DIAGNOSIS — R509 Fever, unspecified: Secondary | ICD-10-CM | POA: Diagnosis not present

## 2017-01-05 DIAGNOSIS — D72829 Elevated white blood cell count, unspecified: Secondary | ICD-10-CM | POA: Diagnosis not present

## 2017-01-05 DIAGNOSIS — D2261 Melanocytic nevi of right upper limb, including shoulder: Secondary | ICD-10-CM | POA: Diagnosis not present

## 2017-01-05 DIAGNOSIS — K352 Acute appendicitis with generalized peritonitis: Secondary | ICD-10-CM | POA: Diagnosis not present

## 2017-01-05 DIAGNOSIS — D225 Melanocytic nevi of trunk: Secondary | ICD-10-CM | POA: Diagnosis not present

## 2017-01-05 DIAGNOSIS — M1611 Unilateral primary osteoarthritis, right hip: Secondary | ICD-10-CM | POA: Diagnosis not present

## 2017-01-05 DIAGNOSIS — K3532 Acute appendicitis with perforation and localized peritonitis, without abscess: Secondary | ICD-10-CM | POA: Diagnosis present

## 2017-01-05 DIAGNOSIS — L570 Actinic keratosis: Secondary | ICD-10-CM | POA: Diagnosis not present

## 2017-01-05 HISTORY — PX: LAPAROSCOPIC APPENDECTOMY: SHX408

## 2017-01-05 LAB — COMPREHENSIVE METABOLIC PANEL
ALBUMIN: 4.5 g/dL (ref 3.5–5.0)
ALT: 15 U/L — AB (ref 17–63)
AST: 18 U/L (ref 15–41)
Alkaline Phosphatase: 48 U/L (ref 38–126)
Anion gap: 12 (ref 5–15)
BILIRUBIN TOTAL: 1.1 mg/dL (ref 0.3–1.2)
BUN: 18 mg/dL (ref 6–20)
CHLORIDE: 104 mmol/L (ref 101–111)
CO2: 22 mmol/L (ref 22–32)
CREATININE: 1.03 mg/dL (ref 0.61–1.24)
Calcium: 9 mg/dL (ref 8.9–10.3)
GFR calc Af Amer: >60 mL/min (ref >60)
Glucose, Bld: 138 mg/dL — ABNORMAL HIGH (ref 65–99)
Potassium: 3.8 mmol/L (ref 3.5–5.1)
Sodium: 138 mmol/L (ref 135–145)
TOTAL PROTEIN: 7 g/dL (ref 6.5–8.1)

## 2017-01-05 LAB — CBC WITH DIFFERENTIAL/PLATELET
BASOS ABS: 0 10*3/uL (ref 0.0–0.1)
BASOS PCT: 0 %
EOS ABS: 0 10*3/uL (ref 0.0–0.7)
EOS PCT: 0 %
HCT: 43.6 % (ref 39.0–52.0)
Hemoglobin: 15.4 g/dL (ref 13.0–17.0)
Lymphocytes Relative: 7 %
Lymphs Abs: 0.8 10*3/uL (ref 0.7–4.0)
MCH: 32.4 pg (ref 26.0–34.0)
MCHC: 35.3 g/dL (ref 30.0–36.0)
MCV: 91.8 fL (ref 78.0–100.0)
Monocytes Absolute: 0.7 10*3/uL (ref 0.1–1.0)
Monocytes Relative: 6 %
Neutro Abs: 10.7 10*3/uL — ABNORMAL HIGH (ref 1.7–7.7)
Neutrophils Relative %: 87 %
PLATELETS: 176 10*3/uL (ref 150–400)
RBC: 4.75 MIL/uL (ref 4.22–5.81)
RDW: 13.4 % (ref 11.5–15.5)
WBC: 12.3 10*3/uL — AB (ref 4.0–10.5)

## 2017-01-05 LAB — URINALYSIS, MICROSCOPIC (REFLEX): RBC / HPF: NONE SEEN RBC/hpf (ref 0–5)

## 2017-01-05 LAB — URINALYSIS, ROUTINE W REFLEX MICROSCOPIC
BILIRUBIN URINE: NEGATIVE
Glucose, UA: NEGATIVE mg/dL
HGB URINE DIPSTICK: NEGATIVE
Ketones, ur: >80 mg/dL — AB
Nitrite: NEGATIVE
Protein, ur: NEGATIVE mg/dL
SPECIFIC GRAVITY, URINE: 1.025 (ref 1.005–1.030)
pH: 7.5 (ref 5.0–8.0)

## 2017-01-05 LAB — LIPASE, BLOOD: Lipase: 22 U/L (ref 11–51)

## 2017-01-05 SURGERY — APPENDECTOMY, LAPAROSCOPIC
Anesthesia: General | Site: Abdomen

## 2017-01-05 MED ORDER — DIPHENHYDRAMINE HCL 50 MG/ML IJ SOLN
12.5000 mg | Freq: Four times a day (QID) | INTRAMUSCULAR | Status: DC | PRN
  Administered 2017-01-05 – 2017-01-06 (×2): 12.5 mg via INTRAVENOUS
  Filled 2017-01-05 (×2): qty 1

## 2017-01-05 MED ORDER — OXYCODONE HCL 5 MG PO TABS: 5.0000 mg | ORAL_TABLET | ORAL | Status: DC | PRN

## 2017-01-05 MED ORDER — DEXAMETHASONE SODIUM PHOSPHATE 10 MG/ML IJ SOLN
INTRAMUSCULAR | Status: AC
  Filled 2017-01-05: qty 1

## 2017-01-05 MED ORDER — PANTOPRAZOLE SODIUM 40 MG IV SOLR
40.0000 mg | Freq: Every day | INTRAVENOUS | Status: DC
  Administered 2017-01-05 – 2017-01-06 (×2): 40 mg via INTRAVENOUS
  Filled 2017-01-05 (×2): qty 40

## 2017-01-05 MED ORDER — PIPERACILLIN-TAZOBACTAM 3.375 G IVPB
3.3750 g | Freq: Three times a day (TID) | INTRAVENOUS | Status: DC
  Administered 2017-01-05 – 2017-01-07 (×5): 3.375 g via INTRAVENOUS
  Filled 2017-01-05 (×6): qty 50

## 2017-01-05 MED ORDER — ONDANSETRON HCL 4 MG/2ML IJ SOLN
INTRAMUSCULAR | Status: AC
Start: 2017-01-05 — End: 2017-01-05
  Filled 2017-01-05: qty 2

## 2017-01-05 MED ORDER — SODIUM CHLORIDE 0.9 % IV BOLUS (SEPSIS)
1000.0000 mL | Freq: Once | INTRAVENOUS | Status: AC
  Administered 2017-01-05: 1000 mL via INTRAVENOUS

## 2017-01-05 MED ORDER — ACETAMINOPHEN 500 MG PO TABS
1000.0000 mg | ORAL_TABLET | Freq: Once | ORAL | Status: AC
  Administered 2017-01-05: 1000 mg via ORAL
  Filled 2017-01-05: qty 2

## 2017-01-05 MED ORDER — FENTANYL CITRATE (PF) 100 MCG/2ML IJ SOLN
INTRAMUSCULAR | Status: DC | PRN
  Administered 2017-01-05 (×4): 50 ug via INTRAVENOUS

## 2017-01-05 MED ORDER — DIPHENHYDRAMINE HCL 12.5 MG/5ML PO ELIX: 12.5000 mg | ORAL_SOLUTION | Freq: Four times a day (QID) | ORAL | Status: DC | PRN

## 2017-01-05 MED ORDER — ENOXAPARIN SODIUM 40 MG/0.4ML ~~LOC~~ SOLN
40.0000 mg | SUBCUTANEOUS | Status: DC
  Administered 2017-01-06 – 2017-01-07 (×2): 40 mg via SUBCUTANEOUS
  Filled 2017-01-05 (×2): qty 0.4

## 2017-01-05 MED ORDER — BUPIVACAINE LIPOSOME 1.3 % IJ SUSP
20.0000 mL | Freq: Once | INTRAMUSCULAR | Status: DC
  Filled 2017-01-05: qty 20

## 2017-01-05 MED ORDER — IOPAMIDOL (ISOVUE-300) INJECTION 61%
100.0000 mL | Freq: Once | INTRAVENOUS | Status: AC | PRN
  Administered 2017-01-05: 100 mL via INTRAVENOUS

## 2017-01-05 MED ORDER — SUGAMMADEX SODIUM 200 MG/2ML IV SOLN
INTRAVENOUS | Status: AC
  Filled 2017-01-05: qty 2

## 2017-01-05 MED ORDER — 0.9 % SODIUM CHLORIDE (POUR BTL) OPTIME
TOPICAL | Status: DC | PRN
  Administered 2017-01-05: 1000 mL

## 2017-01-05 MED ORDER — FENTANYL CITRATE (PF) 100 MCG/2ML IJ SOLN
INTRAMUSCULAR | Status: AC
  Filled 2017-01-05: qty 2

## 2017-01-05 MED ORDER — ROCURONIUM BROMIDE 50 MG/5ML IV SOSY
PREFILLED_SYRINGE | INTRAVENOUS | Status: AC
  Filled 2017-01-05: qty 5

## 2017-01-05 MED ORDER — SODIUM CHLORIDE 0.9 % IJ SOLN
INTRAMUSCULAR | Status: AC
  Filled 2017-01-05: qty 50

## 2017-01-05 MED ORDER — ONDANSETRON HCL 4 MG/2ML IJ SOLN: 4.0000 mg | Freq: Once | INTRAMUSCULAR | Status: DC | PRN

## 2017-01-05 MED ORDER — SUCCINYLCHOLINE CHLORIDE 200 MG/10ML IV SOSY
PREFILLED_SYRINGE | INTRAVENOUS | Status: AC
  Filled 2017-01-05: qty 10

## 2017-01-05 MED ORDER — SUCCINYLCHOLINE CHLORIDE 200 MG/10ML IV SOSY
PREFILLED_SYRINGE | INTRAVENOUS | Status: DC | PRN
  Administered 2017-01-05: 100 mg via INTRAVENOUS

## 2017-01-05 MED ORDER — PROPOFOL 10 MG/ML IV BOLUS
INTRAVENOUS | Status: AC
  Filled 2017-01-05: qty 20

## 2017-01-05 MED ORDER — LIDOCAINE 2% (20 MG/ML) 5 ML SYRINGE
INTRAMUSCULAR | Status: AC
  Filled 2017-01-05: qty 5

## 2017-01-05 MED ORDER — DEXAMETHASONE SODIUM PHOSPHATE 10 MG/ML IJ SOLN
INTRAMUSCULAR | Status: DC | PRN
  Administered 2017-01-05: 10 mg via INTRAVENOUS

## 2017-01-05 MED ORDER — ONDANSETRON HCL 4 MG/2ML IJ SOLN: 4.0000 mg | Freq: Four times a day (QID) | INTRAMUSCULAR | Status: DC | PRN

## 2017-01-05 MED ORDER — LACTATED RINGERS IV SOLN
INTRAVENOUS | Status: DC
  Administered 2017-01-05: 16:00:00 via INTRAVENOUS

## 2017-01-05 MED ORDER — LIDOCAINE 2% (20 MG/ML) 5 ML SYRINGE
INTRAMUSCULAR | Status: DC | PRN
  Administered 2017-01-05: 80 mg via INTRAVENOUS

## 2017-01-05 MED ORDER — SIMETHICONE 80 MG PO CHEW: 40.0000 mg | CHEWABLE_TABLET | Freq: Four times a day (QID) | ORAL | Status: DC | PRN

## 2017-01-05 MED ORDER — PROMETHAZINE HCL 25 MG/ML IJ SOLN: 12.5000 mg | Freq: Four times a day (QID) | INTRAMUSCULAR | Status: DC | PRN

## 2017-01-05 MED ORDER — ONDANSETRON HCL 4 MG/2ML IJ SOLN
4.0000 mg | Freq: Once | INTRAMUSCULAR | Status: AC | PRN
  Administered 2017-01-05: 4 mg via INTRAVENOUS
  Filled 2017-01-05: qty 2

## 2017-01-05 MED ORDER — SUGAMMADEX SODIUM 200 MG/2ML IV SOLN
INTRAVENOUS | Status: DC | PRN
  Administered 2017-01-05: 200 mg via INTRAVENOUS

## 2017-01-05 MED ORDER — ACETAMINOPHEN 500 MG PO TABS
1000.0000 mg | ORAL_TABLET | Freq: Four times a day (QID) | ORAL | Status: DC
  Administered 2017-01-05 – 2017-01-07 (×7): 1000 mg via ORAL
  Filled 2017-01-05 (×7): qty 2

## 2017-01-05 MED ORDER — KETOROLAC TROMETHAMINE 15 MG/ML IJ SOLN
15.0000 mg | Freq: Four times a day (QID) | INTRAMUSCULAR | Status: DC | PRN
  Administered 2017-01-05: 15 mg via INTRAVENOUS
  Filled 2017-01-05: qty 1

## 2017-01-05 MED ORDER — PROPOFOL 10 MG/ML IV BOLUS
INTRAVENOUS | Status: DC | PRN
  Administered 2017-01-05: 200 mg via INTRAVENOUS

## 2017-01-05 MED ORDER — BUPIVACAINE-EPINEPHRINE 0.25% -1:200000 IJ SOLN
INTRAMUSCULAR | Status: DC | PRN
Start: 2017-01-05 — End: 2017-01-05
  Administered 2017-01-05: 40 mL

## 2017-01-05 MED ORDER — DEXTROSE 5 % IV SOLN
2.0000 g | Freq: Once | INTRAVENOUS | Status: AC
  Administered 2017-01-05: 2 g via INTRAVENOUS
  Filled 2017-01-05: qty 2

## 2017-01-05 MED ORDER — KCL IN DEXTROSE-NACL 20-5-0.45 MEQ/L-%-% IV SOLN
INTRAVENOUS | Status: DC
  Administered 2017-01-05 – 2017-01-06 (×3): via INTRAVENOUS
  Filled 2017-01-05 (×5): qty 1000

## 2017-01-05 MED ORDER — FENTANYL CITRATE (PF) 100 MCG/2ML IJ SOLN: 25.0000 ug | INTRAMUSCULAR | Status: DC | PRN

## 2017-01-05 MED ORDER — MORPHINE SULFATE (PF) 4 MG/ML IV SOLN
4.0000 mg | Freq: Once | INTRAVENOUS | Status: AC
  Administered 2017-01-05: 4 mg via INTRAVENOUS
  Filled 2017-01-05: qty 1

## 2017-01-05 MED ORDER — LACTATED RINGERS IR SOLN
Status: DC | PRN
  Administered 2017-01-05: 3000 mL
  Administered 2017-01-05: 1000 mL

## 2017-01-05 MED ORDER — BUPIVACAINE-EPINEPHRINE 0.25% -1:200000 IJ SOLN
INTRAMUSCULAR | Status: AC
  Filled 2017-01-05: qty 1

## 2017-01-05 MED ORDER — ROCURONIUM BROMIDE 50 MG/5ML IV SOSY
PREFILLED_SYRINGE | INTRAVENOUS | Status: DC | PRN
  Administered 2017-01-05: 20 mg via INTRAVENOUS
  Administered 2017-01-05: 30 mg via INTRAVENOUS
  Administered 2017-01-05: 10 mg via INTRAVENOUS

## 2017-01-05 MED ORDER — METRONIDAZOLE IN NACL 5-0.79 MG/ML-% IV SOLN
500.0000 mg | Freq: Once | INTRAVENOUS | Status: AC
  Administered 2017-01-05: 500 mg via INTRAVENOUS

## 2017-01-05 MED ORDER — ONDANSETRON HCL 4 MG/2ML IJ SOLN
INTRAMUSCULAR | Status: DC | PRN
  Administered 2017-01-05: 4 mg via INTRAVENOUS

## 2017-01-05 MED ORDER — ONDANSETRON 4 MG PO TBDP: 4.0000 mg | ORAL_TABLET | Freq: Four times a day (QID) | ORAL | Status: DC | PRN

## 2017-01-05 SURGICAL SUPPLY — 46 items
APPLIER CLIP 5 13 M/L LIGAMAX5 (MISCELLANEOUS)
APPLIER CLIP ROT 10 11.4 M/L (STAPLE)
BENZOIN TINCTURE PRP APPL 2/3 (GAUZE/BANDAGES/DRESSINGS) ×3 IMPLANT
CABLE HIGH FREQUENCY MONO STRZ (ELECTRODE) ×3 IMPLANT
CLIP APPLIE 5 13 M/L LIGAMAX5 (MISCELLANEOUS) IMPLANT
CLIP APPLIE ROT 10 11.4 M/L (STAPLE) IMPLANT
CLOSURE WOUND 1/2 X4 (GAUZE/BANDAGES/DRESSINGS) ×1
COVER SURGICAL LIGHT HANDLE (MISCELLANEOUS) ×3 IMPLANT
CUTTER FLEX LINEAR 45M (STAPLE) IMPLANT
DECANTER SPIKE VIAL GLASS SM (MISCELLANEOUS) ×3 IMPLANT
DERMABOND ADVANCED (GAUZE/BANDAGES/DRESSINGS)
DERMABOND ADVANCED .7 DNX12 (GAUZE/BANDAGES/DRESSINGS) IMPLANT
DRAPE LAPAROSCOPIC ABDOMINAL (DRAPES) ×3 IMPLANT
DRSG TEGADERM 4X4.75 (GAUZE/BANDAGES/DRESSINGS) ×3 IMPLANT
ELECT PENCIL ROCKER SW 15FT (MISCELLANEOUS) IMPLANT
ELECT REM PT RETURN 15FT ADLT (MISCELLANEOUS) ×3 IMPLANT
GAUZE SPONGE 2X2 8PLY STRL LF (GAUZE/BANDAGES/DRESSINGS) ×1 IMPLANT
GLOVE BIO SURGEON STRL SZ7.5 (GLOVE) ×3 IMPLANT
GLOVE INDICATOR 8.0 STRL GRN (GLOVE) ×3 IMPLANT
GOWN STRL REUS W/TWL LRG LVL3 (GOWN DISPOSABLE) ×3 IMPLANT
GOWN STRL REUS W/TWL XL LVL3 (GOWN DISPOSABLE) ×6 IMPLANT
GRASPER SUT TROCAR 14GX15 (MISCELLANEOUS) ×3 IMPLANT
IRRIG SUCT STRYKERFLOW 2 WTIP (MISCELLANEOUS) ×3
IRRIGATION SUCT STRKRFLW 2 WTP (MISCELLANEOUS) ×1 IMPLANT
IV LACTATED RINGERS 1000ML (IV SOLUTION) ×3 IMPLANT
KIT BASIN OR (CUSTOM PROCEDURE TRAY) ×3 IMPLANT
L-HOOK LAP DISP 36CM (ELECTROSURGICAL)
LHOOK LAP DISP 36CM (ELECTROSURGICAL) IMPLANT
POUCH RETRIEVAL ECOSAC 10 (ENDOMECHANICALS) ×1 IMPLANT
POUCH RETRIEVAL ECOSAC 10MM (ENDOMECHANICALS) ×2
RELOAD 45 VASCULAR/THIN (ENDOMECHANICALS) IMPLANT
RELOAD STAPLE TA45 3.5 REG BLU (ENDOMECHANICALS) ×3 IMPLANT
SHEARS HARMONIC ACE PLUS 36CM (ENDOMECHANICALS) ×3 IMPLANT
SPONGE GAUZE 2X2 STER 10/PKG (GAUZE/BANDAGES/DRESSINGS) ×2
STRIP CLOSURE SKIN 1/2X4 (GAUZE/BANDAGES/DRESSINGS) ×2 IMPLANT
SUT MNCRL AB 4-0 PS2 18 (SUTURE) ×3 IMPLANT
SUT VIC AB 3-0 SH 27 (SUTURE) ×2
SUT VIC AB 3-0 SH 27XBRD (SUTURE) ×1 IMPLANT
SUT VICRYL 0 TIES 12 18 (SUTURE) IMPLANT
SUT VICRYL 0 UR6 27IN ABS (SUTURE) ×9 IMPLANT
TOWEL OR 17X26 10 PK STRL BLUE (TOWEL DISPOSABLE) ×3 IMPLANT
TRAY FOLEY W/METER SILVER 16FR (SET/KITS/TRAYS/PACK) ×3 IMPLANT
TRAY LAPAROSCOPIC (CUSTOM PROCEDURE TRAY) ×3 IMPLANT
TROCAR XCEL BLUNT TIP 100MML (ENDOMECHANICALS) ×3 IMPLANT
TROCAR XCEL NON-BLD 5MMX100MML (ENDOMECHANICALS) ×6 IMPLANT
TUBING INSUF HEATED (TUBING) ×3 IMPLANT

## 2017-01-05 NOTE — Anesthesia Procedure Notes (Addendum)
Procedure Name: Intubation Date/Time: 01/05/2017 4:31 PM Performed by: Dione Booze Pre-anesthesia Checklist: Patient identified, Emergency Drugs available, Suction available and Patient being monitored Patient Re-evaluated:Patient Re-evaluated prior to induction Oxygen Delivery Method: Circle system utilized Preoxygenation: Pre-oxygenation with 100% oxygen Induction Type: IV induction, Rapid sequence and Cricoid Pressure applied Laryngoscope Size: Miller and 2 Grade View: Grade I Tube type: Oral Tube size: 7.5 mm Number of attempts: 1 Airway Equipment and Method: Stylet Placement Confirmation: ETT inserted through vocal cords under direct vision,  positive ETCO2 and breath sounds checked- equal and bilateral Secured at: 22 cm Tube secured with: Tape Dental Injury: Teeth and Oropharynx as per pre-operative assessment

## 2017-01-05 NOTE — Op Note (Signed)
Jeremy Norman [id number] Feb 28, 1951 01/05/2017  Appendectomy, Lap, Procedure Note  Indications: The patient presented with a history of right-sided abdominal pain. A CT revealed findings consistent with acute appendicitis with possible perforation. Please see chart for additional details  Pre-operative Diagnosis: Acute appendicitis without mention of peritonitis  Post-operative Diagnosis: perforated gangrenous appendicitis  Surgeon: Gayland Curry   Assistants: none  Anesthesia: General endotracheal anesthesia  Procedure Details  The patient was seen again in the Holding Room. The risks, benefits, complications, treatment options, and expected outcomes were discussed with the patient and/or family. The possibilities of perforation of viscus, bleeding, recurrent infection, the need for additional procedures, failure to diagnose a condition, and creating a complication requiring transfusion or operation were discussed. There was concurrence with the proposed plan and informed consent was obtained. The site of surgery was properly noted. The patient was taken to Operating Room, identified as Jeremy Norman and the procedure verified as Appendectomy. A Time Out was held and the above information confirmed.  The patient was placed in the supine position and general anesthesia was induced, along with placement of orogastric tube, SCDs, and a Foley catheter. The abdomen was prepped and draped in a sterile fashion. A 3 centimeter infraumbilical incision was made.  The umbilical stalk was elevated. The patient had a pre-existing small umbilical hernia. The defect was grasped with Kocher's and the defect was further incised with a #11 blade.  A Kelly clamp was used to confirm entrance into the peritoneal cavity.  A pursestring suture was passed around the incision with a 0 Vicryl.  A 35mm Hasson was introduced into the abdomen and the tails of the suture were used to hold the Hasson in place.   The  pneumoperitoneum was then established to steady pressure of 15 mmHg.  Additional 5 mm cannulas then placed in the left lower quadrant of the abdomen and the suprapubic region under direct visualization. A careful evaluation of the entire abdomen was carried out. The patient was placed in Trendelenburg and left lateral decubitus position. The small intestines were retracted in the cephalad and left lateral direction away from the pelvis and right lower quadrant. There was purulent fluid in the pelvis, right paracolic gutter and in the right upper quadrant. The patient was found to have an inflamed appendix that was extending back onto the anterior surface of the cecum. There was evidence of perforation in the mid body along with gangrene in this area   The appendix was carefully dissected. The appendix was was skeletonized with the harmonic scalpel.   The appendix was divided at its base using an endo-GIA stapler with a blue load. No appendiceal stump was left in place. The appendix was removed from the abdomen with an Ecco bag through the umbilical port.  There was no evidence of bleeding, leakage, or complication after division of the appendix. 4L Irrigation was also performed and irrigate suctioned from the abdomen as well.  The umbilical port site was closed with the purse string suture. 3 additional sutures were placed using a PMI suture passer since the patient had a pre-existing fascial defect with a 0 Vicryl. The closure was viewed laparoscopically. There was no residual palpable fascial defect. Additional local was infiltrated in the preperitoneal space around the umbilicus. The umbilical stalk was reattached to the fascia with a single 3-0 Vicryl. The trocar site skin wounds were closed with 4-0 Monocryl. Benzoin, Steri-Strips, and sterile bandages was applied to the skin incisions.  Instrument, sponge, and  needle counts were correct at the conclusion of the case.   Findings: The appendix was found  to be inflamed. There were signs of necrosis.  There was perforation. There was not abscess formation. Patient had a small pre-existing fascial defect at his umbilicus at 0.5 cm. This was enlarged slightly and used as the Trocar Site  Estimated Blood Loss:  Minimal         Drains: None         Specimens: Appendix         Complications:  None; patient tolerated the procedure well.         Disposition: PACU - hemodynamically stable.         Condition: stable  Jeremy Norman. Redmond Pulling, MD, FACS General, Bariatric, & Minimally Invasive Surgery [facility], [place]

## 2017-01-05 NOTE — Anesthesia Postprocedure Evaluation (Signed)
Anesthesia Post Note  Patient: Jeremy Norman  Procedure(s) Performed: Procedure(s) (LRB): APPENDECTOMY LAPAROSCOPIC/UMBILICAL HERNIA REPAIR (N/A)     Patient location during evaluation: PACU Anesthesia Type: General Level of consciousness: awake and alert Pain management: pain level controlled Vital Signs Assessment: post-procedure vital signs reviewed and stable Respiratory status: spontaneous breathing, nonlabored ventilation and respiratory function stable Cardiovascular status: blood pressure returned to baseline and stable Postop Assessment: no signs of nausea or vomiting Anesthetic complications: no    Last Vitals:  Vitals:   01/05/17 1830 01/05/17 1835  BP: (!) 101/46 (!) 103/49  Pulse: 86 87  Resp: 20 17  Temp: 36.5 C     Last Pain:  Vitals:   01/05/17 1351  TempSrc: Oral  PainSc:                  Catalina Gravel

## 2017-01-05 NOTE — H&P (Signed)
Jeremy Norman is an 66 y.o. male.   Chief Complaint: RLQ pain started yesterday-nausea, chills today-presents to triage in w/c shaking HPI: Pt presented to [facility] today with the above noted complaints after evaluation at [facility].   Yesterday he has some generalized abdominal cramps and a few loose BM in the evening. He pretty much ignored it and went to bed.  He took some TUMS before bed.   He slept well overnight. This AM he had a sudden onset of worsening pain now In the RLQ at the dermatology office during checkout.  Pain was 8/10, he stared having chills, some nausea followed by some emesis.  On arrival to the ED he had a fever of 100.6. Work up in the ED shows:  Fever 100.6 down to 99.9. VSS.  Glucose is 138,  ALT 15, WBC 12.3 with left shift.  CT scan shows bilateral nephrolithiasis with no hydronephrosis or obstruction. Sigmoid diverticulosis is noted without inflammation. The appendix is enlarged and inflamed consistent with acute appendicitis. Small amount of soft tissue gas is seen in this area suggesting perforation. There is also noted wall thickening and enhancement involving the terminal ileum which may represent secondary inflammation secondary to the appendicitis. He was referred to Dr. Redmond Pulling and [facility].  Transferred to [facility] with pain in the RLQ.  He is tachycardic and fever now is 100.9, HR just around 100.     Past Medical History:  Diagnosis Date  . Acute shoulder pain 12/02/13   right shoulder-fell on shoulder on 12/02/13  . GERD (gastroesophageal reflux disease)    occasional   . Osteoarthritis     Past Surgical History:  Procedure Laterality Date  . REFRACTIVE SURGERY Bilateral 15 years ago  . TOTAL HIP ARTHROPLASTY Right 12/15/2013   Procedure: RIGHT TOTAL HIP ARTHROPLASTY ANTERIOR APPROACH;  Surgeon: Mcarthur Rossetti, MD;  Location: [facility]S;  Service: Orthopedics;  Laterality: Right;  . TOTAL KNEE ARTHROPLASTY Left 2002    No family history on file. Social  History:  reports that he has quit smoking. His smoking use included Cigarettes. He smoked 2.00 packs per day. He has never used smokeless tobacco. He reports that he drinks alcohol. He reports that he does not use drugs. Retired Married - family is with him Tobacco: 2+PPD for about 12 years, quit 35 years ago Drugs:  None ETOH:  Social   Allergies:  Allergies  Allergen Reactions  . Morphine And Related Nausea And Vomiting    Meds at home:  None except occasional Ibuprofen and MVI  Results for orders placed or performed during the hospital encounter of 01/05/17 (from the past 48 hour(s))  Lipase, blood     Status: None   Collection Time: 01/05/17 11:47 AM  Result Value Ref Range   Lipase 22 11 - 51 U/L  Comprehensive metabolic panel     Status: Abnormal   Collection Time: 01/05/17 11:47 AM  Result Value Ref Range   Sodium 138 135 - 145 mmol/L   Potassium 3.8 3.5 - 5.1 mmol/L   Chloride 104 101 - 111 mmol/L   CO2 22 22 - 32 mmol/L   Glucose, Bld 138 (H) 65 - 99 mg/dL   BUN 18 6 - 20 mg/dL   Creatinine, Ser 1.03 0.61 - 1.24 mg/dL   Calcium 9.0 8.9 - 10.3 mg/dL   Total Protein 7.0 6.5 - 8.1 g/dL   Albumin 4.5 3.5 - 5.0 g/dL   AST 18 15 - 41 U/L  ALT 15 (L) 17 - 63 U/L   Alkaline Phosphatase 48 38 - 126 U/L   Total Bilirubin 1.1 0.3 - 1.2 mg/dL   GFR calc non Af Amer >60 >60 mL/min   GFR calc Af Amer >60 >60 mL/min    Comment: (NOTE) The eGFR has been calculated using the CKD EPI equation. This calculation has not been validated in all clinical situations. eGFR's persistently <60 mL/min signify possible Chronic Kidney Disease.    Anion gap 12 5 - 15  Urinalysis, Routine w reflex microscopic     Status: Abnormal   Collection Time: 01/05/17 11:47 AM  Result Value Ref Range   Color, Urine AMBER (A) YELLOW    Comment: BIOCHEMICALS MAY BE AFFECTED BY COLOR   APPearance CLOUDY (A) CLEAR   Specific Gravity, Urine 1.025 1.005 - 1.030   pH 7.5 5.0 - 8.0   Glucose, UA  NEGATIVE NEGATIVE mg/dL   Hgb urine dipstick NEGATIVE NEGATIVE   Bilirubin Urine NEGATIVE NEGATIVE   Ketones, ur >80 (A) NEGATIVE mg/dL   Protein, ur NEGATIVE NEGATIVE mg/dL   Nitrite NEGATIVE NEGATIVE   Leukocytes, UA TRACE (A) NEGATIVE  CBC with Differential/Platelet     Status: Abnormal   Collection Time: 01/05/17 11:47 AM  Result Value Ref Range   WBC 12.3 (H) 4.0 - 10.5 K/uL   RBC 4.75 4.22 - 5.81 MIL/uL   Hemoglobin 15.4 13.0 - 17.0 g/dL   HCT 43.6 39.0 - 52.0 %   MCV 91.8 78.0 - 100.0 fL   MCH 32.4 26.0 - 34.0 pg   MCHC 35.3 30.0 - 36.0 g/dL   RDW 13.4 11.5 - 15.5 %   Platelets 176 150 - 400 K/uL   Neutrophils Relative % 87 %   Neutro Abs 10.7 (H) 1.7 - 7.7 K/uL   Lymphocytes Relative 7 %   Lymphs Abs 0.8 0.7 - 4.0 K/uL   Monocytes Relative 6 %   Monocytes Absolute 0.7 0.1 - 1.0 K/uL   Eosinophils Relative 0 %   Eosinophils Absolute 0.0 0.0 - 0.7 K/uL   Basophils Relative 0 %   Basophils Absolute 0.0 0.0 - 0.1 K/uL  Urinalysis, Microscopic (reflex)     Status: Abnormal   Collection Time: 01/05/17 11:47 AM  Result Value Ref Range   RBC / HPF NONE SEEN 0 - 5 RBC/hpf   WBC, UA 0-5 0 - 5 WBC/hpf   Bacteria, UA FEW (A) NONE SEEN   Squamous Epithelial / LPF 0-5 (A) NONE SEEN   Mucous PRESENT    Amorphous Crystal PRESENT    Ct Abdomen Pelvis W Contrast  Result Date: 01/05/2017 CLINICAL DATA:  Acute right lower quadrant abdominal pain. EXAM: CT ABDOMEN AND PELVIS WITH CONTRAST TECHNIQUE: Multidetector CT imaging of the abdomen and pelvis was performed using the standard protocol following bolus administration of intravenous contrast. CONTRAST:  152m ISOVUE-300 IOPAMIDOL (ISOVUE-300) INJECTION 61% COMPARISON:  None. FINDINGS: Lower chest: No acute abnormality. Hepatobiliary: No focal liver abnormality is seen. No gallstones, gallbladder wall thickening, or biliary dilatation. Pancreas: Unremarkable. No pancreatic ductal dilatation or surrounding inflammatory changes. Spleen:  Normal in size without focal abnormality. Adrenals/Urinary Tract: Adrenal glands appear normal. Bilateral parapelvic cysts are noted. Bilateral nephrolithiasis is noted. No hydronephrosis or renal obstruction is noted. Urinary bladder is unremarkable. Stomach/Bowel: The stomach appears normal. There is no evidence of bowel obstruction. Sigmoid diverticulosis is noted without inflammation. The appendix is enlarged and inflamed consistent with acute appendicitis. Small amount of soft tissue gas is  seen in this area suggesting perforation. There is also noted wall thickening and enhancement involving the terminal ileum which may represent secondary inflammation secondary to the appendicitis. Less likely, Crohn's disease cannot be excluded. Vascular/Lymphatic: Aortic atherosclerosis. No enlarged abdominal or pelvic lymph nodes. Reproductive: Mild prostatic enlargement is noted. Other: No abdominal wall hernia or abnormality. No abdominopelvic ascites. Musculoskeletal: Status post right hip arthroplasty. No acute osseous abnormality is noted. IMPRESSION: The appendix is enlarged and inflamed consistent with acute appendicitis, with a small amount of soft tissue gas in this area suggesting perforation. Wall thickening and enhancement involving the adjacent terminal ileum is also noted, which most likely represent secondary inflammation from the appendicitis. Crohn's disease is also a possibility although felt to be less likely. Critical Value/emergent results were called by telephone at the time of interpretation on 01/05/2017 at 1:07 pm to Dr. Reece Agar , who verbally acknowledged these results. Aortic atherosclerosis. Mild prostatic enlargement. Electronically Signed   By: Marijo Conception, M.D.   On: 01/05/2017 13:08    Review of Systems  Constitutional: Positive for chills, fever and weight loss (has been on a Diet and lost 32 lbs over the last year). Negative for diaphoresis and malaise/fatigue.  HENT:  Negative.   Eyes: Negative.   Respiratory: Negative.   Cardiovascular: Negative.   Gastrointestinal: Positive for abdominal pain, diarrhea, nausea and vomiting. Negative for blood in stool, constipation and melena.  Genitourinary: Negative.   Musculoskeletal: Negative.   Skin: Negative.        He was seen by dermatologist and had a place removed fromhis forehead this AM  Neurological: Negative.  Negative for weakness.  Endo/Heme/Allergies: Negative.   Psychiatric/Behavioral: Negative.     Blood pressure 131/68, pulse 97, temperature (!) 100.6 F (38.1 C), temperature source Oral, resp. rate 17, height '5\' 8"'  (1.727 m), weight 90.7 kg (200 lb), SpO2 98 %. Physical Exam  Constitutional: He is oriented to person, place, and time. He appears well-developed and well-nourished. No distress.  HENT:  Head: Normocephalic and atraumatic.  Mouth/Throat: No oropharyngeal exudate.  Eyes: Right eye exhibits no discharge. Left eye exhibits no discharge. No scleral icterus.  Pupils are equal  Neck: Normal range of motion. Neck supple. No JVD present. No tracheal deviation present. No thyromegaly present.  Cardiovascular: Normal rate, regular rhythm and intact distal pulses.   No murmur heard. Respiratory: Effort normal and breath sounds normal. No respiratory distress. He has no wheezes. He has no rales. He exhibits no tenderness.  GI: Soft. Bowel sounds are normal. He exhibits no distension and no mass. There is tenderness (Tender RLQ). There is no rebound and no guarding.  Musculoskeletal: He exhibits no edema or tenderness.  Lymphadenopathy:    He has no cervical adenopathy.  Neurological: He is alert and oriented to person, place, and time. A cranial nerve deficit is present.  Skin: Skin is warm and dry. He is not diaphoretic. No erythema. No pallor.  Psychiatric: He has a normal mood and affect. His behavior is normal. Judgment and thought content normal.     Assessment/Plan Acute  appendicitis with possible perforation Nephrolithiasis without obstruction Hx of tobacco use  Plan:  He has had Rocephin and Flagyl, convert to Zosyn.  NPO, send to surgery later this afternoon.  I have discussed the procedure and risks of appendectomy. The risks include but are not limited to bleeding, infection, wound problems, anesthesia, injury to intra-abdominal organs, possibility of postoperative ileus. He/she seems to understand and agrees with  the plan.   Yordin Rhoda, PA-C 01/05/2017, 1:37 PM

## 2017-01-05 NOTE — ED Notes (Signed)
ED Provider at bedside. 

## 2017-01-05 NOTE — Transfer of Care (Signed)
Immediate Anesthesia Transfer of Care Note  Patient: Jeremy Norman  Procedure(s) Performed: Procedure(s): APPENDECTOMY LAPAROSCOPIC/UMBILICAL HERNIA REPAIR (N/A)  Patient Location: PACU  Anesthesia Type:General  Level of Consciousness: awake, alert , oriented and patient cooperative  Airway & Oxygen Therapy: Patient Spontanous Breathing and Patient connected to face mask oxygen  Post-op Assessment: Report given to RN and Post -op Vital signs reviewed and stable  Post vital signs: Reviewed and stable  Last Vitals:  Vitals:   01/05/17 1445 01/05/17 1530  BP: (!) 109/58 103/63  Pulse: 96 (!) 105  Resp:    Temp:      Last Pain:  Vitals:   01/05/17 1351  TempSrc: Oral  PainSc:          Complications: No apparent anesthesia complications

## 2017-01-05 NOTE — Anesthesia Preprocedure Evaluation (Signed)
Anesthesia Evaluation  Patient identified by MRN, date of birth, ID band Patient awake    Reviewed: Allergy & Precautions, NPO status , Patient's Chart, lab work & pertinent test results  Airway Mallampati: II  TM Distance: >3 FB Neck ROM: Full    Dental  (+) Teeth Intact, Dental Advisory Given   Pulmonary former smoker,    Pulmonary exam normal breath sounds clear to auscultation       Cardiovascular negative cardio ROS Normal cardiovascular exam Rhythm:Regular Rate:Normal     Neuro/Psych negative neurological ROS     GI/Hepatic Neg liver ROS, GERD  Medicated,Acute appendicitis    Endo/Other  Obesity   Renal/GU negative Renal ROS     Musculoskeletal  (+) Arthritis , Osteoarthritis,    Abdominal   Peds  Hematology negative hematology ROS (+)   Anesthesia Other Findings Day of surgery medications reviewed with the patient.  Reproductive/Obstetrics                             Anesthesia Physical Anesthesia Plan  ASA: II and emergent  Anesthesia Plan: General   Post-op Pain Management:    Induction: Intravenous and Rapid sequence  PONV Risk Score and Plan: 2 and Ondansetron and Dexamethasone  Airway Management Planned: Oral ETT  Additional Equipment:   Intra-op Plan:   Post-operative Plan: Extubation in OR  Informed Consent: I have reviewed the patients History and Physical, chart, labs and discussed the procedure including the risks, benefits and alternatives for the proposed anesthesia with the patient or authorized representative who has indicated his/her understanding and acceptance.   Dental advisory given  Plan Discussed with: CRNA  Anesthesia Plan Comments: (Risks/benefits of general anesthesia discussed with patient including risk of damage to teeth, lips, gum, and tongue, nausea/vomiting, allergic reactions to medications, and the possibility of heart attack,  stroke and death.  All patient questions answered.  Patient wishes to proceed.)        Anesthesia Quick Evaluation

## 2017-01-05 NOTE — ED Triage Notes (Signed)
C/o RLQ pain started yesterday-nausea, chills today-presents to triage in w/c shaking

## 2017-01-05 NOTE — ED Provider Notes (Signed)
[facility] DEPT [facility] Provider Note   CSN: [id number] Arrival date & time: 01/05/17  1110     History   Chief Complaint Chief Complaint  Patient presents with  . Abdominal Pain    HPI Jeremy Norman is a 66 y.o. male with a PMHx of GERD, who presents to the ED sent here from [place] for evaluation of possible appendicitis, with complaints of abdominal pain, n/v/d x1 day. Patient states that yesterday he had some generalized crampy abdominal discomfort and had a few looser than normal bowel movements but didn't think much of it, slept well overnight, woke up feeling okay and went to his doctor's office at around 9 AM when suddenly his pain worsened and localized in the RLQ area. He describes his pain currently as 8/10 constant sharp nonradiating RLQ pain that worsens with movement, and with no treatments tried prior to arrival. He was also having chills and arrived to the emergency room with a fever of 100.6. He was having some nausea and upon arrival had one episode of nonbloody nonbilious emesis. He states over the last 2 days he's had about 4-5 small looser than normal bowel movements but denies any watery diarrhea, and they were nonbloody. He last ate eggs and bacon around 6:50 AM. He denies any recent travel, suspicious food intake, sick contacts, alcohol use, NSAID use, antibiotic use, or prior abdominal surgeries. He states he is otherwise healthy with no medical problems. He denies CP, SOB, constipation, obstipation, melena, hematochezia, hematemesis, hematuria, dysuria, testicular pain/swelling, penile discharge, myalgias, arthralgias, numbness, tingling, focal weakness, or any other complaints at this time. PCP care is primarily through the [place].    The history is provided by the patient and medical records. No language interpreter was used.  Abdominal Pain   This is a new problem. The current episode started yesterday. The problem occurs constantly. The problem has been rapidly  worsening. The pain is associated with an unknown factor. The pain is located in the RLQ. The quality of the pain is sharp. The pain is at a severity of 8/10. The pain is moderate. Associated symptoms include fever, diarrhea, nausea and vomiting. Pertinent negatives include flatus, hematochezia, melena, constipation, dysuria, hematuria, arthralgias and myalgias. The symptoms are aggravated by activity. Nothing relieves the symptoms.    Past Medical History:  Diagnosis Date  . Acute shoulder pain 12/02/13   right shoulder-fell on shoulder on 12/02/13  . GERD (gastroesophageal reflux disease)    occasional   . Osteoarthritis     Patient Active Problem List   Diagnosis Date Noted  . Arthritis of right hip 12/15/2013  . 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 REFRACTIVE SURGERY Bilateral 15 years ago  . TOTAL HIP ARTHROPLASTY Right 12/15/2013   Procedure: RIGHT TOTAL HIP ARTHROPLASTY ANTERIOR APPROACH;  Surgeon: Mcarthur Rossetti, MD;  Location: [facility];  Service: Orthopedics;  Laterality: Right;  . TOTAL KNEE ARTHROPLASTY Left 2002       Home Medications    Prior to Admission medications   Medication Sig Start Date End Date Taking? Authorizing Provider  albuterol (PROVENTIL HFA;VENTOLIN HFA) 108 (90 Base) MCG/ACT inhaler Inhale 1-2 puffs into the lungs every 6 (six) hours as needed for wheezing or shortness of breath. Patient not taking: Reported on 08/11/2016 05/26/16   Noe Gens, PA-C  amoxicillin-clavulanate (AUGMENTIN) 875-125 MG tablet Take 1 tablet by mouth 2 (two) times daily. One po bid  x 7 days Patient not taking: Reported on 08/11/2016 05/26/16   Noe Gens, PA-C  aspirin EC 325 MG EC tablet Take 1 tablet (325 mg total) by mouth 2 (two) times daily after a meal. Patient not taking: Reported on 08/11/2016 12/16/13   Mcarthur Rossetti, MD  HYDROcodone-acetaminophen (NORCO) 5-325 MG per tablet Take  1-2 tablets by mouth every 4 (four) hours as needed for moderate pain. Patient not taking: Reported on 08/11/2016 12/16/13   Mcarthur Rossetti, MD  methocarbamol (ROBAXIN) 500 MG tablet Take 1 tablet (500 mg total) by mouth every 6 (six) hours as needed for muscle spasms. Patient not taking: Reported on 08/11/2016 12/16/13   Mcarthur Rossetti, MD  ondansetron (ZOFRAN ODT) 4 MG disintegrating tablet Take 1 tablet (4 mg total) by mouth every 8 (eight) hours as needed for nausea or vomiting. Patient not taking: Reported on 08/11/2016 12/16/13   Mcarthur Rossetti, MD  predniSONE (DELTASONE) 20 MG tablet 3 tabs po day one, then 2 po daily x 4 days Patient not taking: Reported on 08/11/2016 05/26/16   Noe Gens, PA-C  sildenafil (VIAGRA) 100 MG tablet Take 50 mg by mouth daily as needed for erectile dysfunction.    [provider]  zolpidem (AMBIEN) 10 MG tablet Take 5 mg by mouth at bedtime as needed for sleep.    [provider]    Family History No family history on file.  Social History Social History  Substance Use Topics  . Smoking status: Former Smoker    Packs/day: 2.00    Types: Cigarettes  . Smokeless tobacco: Never Used     Comment: quit smoking in mid 1980s  . Alcohol use Yes     Comment: occ     Allergies   Morphine and related   Review of Systems Review of Systems  Constitutional: Positive for chills and fever.  Respiratory: Negative for shortness of breath.   Cardiovascular: Negative for chest pain.  Gastrointestinal: Positive for abdominal pain, diarrhea, nausea and vomiting. Negative for blood in stool, constipation, flatus, hematochezia and melena.  Genitourinary: Negative for discharge, dysuria, hematuria, scrotal swelling and testicular pain.  Musculoskeletal: Negative for arthralgias and myalgias.  Skin: Negative for color change.  Allergic/Immunologic: Negative for immunocompromised state.  Neurological: Negative for weakness  and numbness.  Psychiatric/Behavioral: Negative for confusion.   All other systems reviewed and are negative for acute change except as noted in the HPI.    Physical Exam Updated Vital Signs BP 110/88 (BP Location: Right Arm)   Pulse 90   Temp (!) 100.6 F (38.1 C) (Oral)   Resp (!) 24   Ht 5\' 8"  (1.727 m)   Wt 90.7 kg (200 lb)   SpO2 98%   BMI 30.41 kg/m   Physical Exam  Constitutional: He is oriented to person, place, and time. He appears well-developed and well-nourished.  Non-toxic appearance. No distress.  Febrile 100.6, nontoxic, NAD  HENT:  Head: Normocephalic and atraumatic.  Mouth/Throat: Oropharynx is clear and moist and mucous membranes are normal.  Eyes: Conjunctivae and EOM are normal. Right eye exhibits no discharge. Left eye exhibits no discharge.  Neck: Normal range of motion. Neck supple.  Cardiovascular: Normal rate, regular rhythm, normal heart sounds and intact distal pulses.  Exam reveals no gallop and no friction rub.   No murmur heard. Pulmonary/Chest: Effort normal and breath sounds normal. No respiratory distress. He has no decreased breath sounds. He has no wheezes. He has no rhonchi.  He has no rales.  Abdominal: Soft. Normal appearance and bowel sounds are normal. He exhibits no distension. There is tenderness in the right lower quadrant. There is guarding and tenderness at McBurney's point. There is no rigidity, no rebound, no CVA tenderness and negative Murphy's sign.  Soft, nondistended, +BS throughout, with moderate RLQ TTP over mcburney's point, +guarding, no rebound/rigidity, neg murphy's, no CVA TTP   Musculoskeletal: Normal range of motion.  Neurological: He is alert and oriented to person, place, and time. He has normal strength. No sensory deficit.  Skin: Skin is warm, dry and intact. No rash noted.  Psychiatric: He has a normal mood and affect.  Nursing note and vitals reviewed.    ED Treatments / Results  Labs (all labs ordered are  listed, but only abnormal results are displayed) Labs Reviewed  COMPREHENSIVE METABOLIC PANEL - Abnormal; Notable for the following:       Result Value   Glucose, Bld 138 (*)    ALT 15 (*)    All other components within normal limits  URINALYSIS, ROUTINE W REFLEX MICROSCOPIC - Abnormal; Notable for the following:    Color, Urine AMBER (*)    APPearance CLOUDY (*)    Ketones, ur >80 (*)    Leukocytes, UA TRACE (*)    All other components within normal limits  CBC WITH DIFFERENTIAL/PLATELET - Abnormal; Notable for the following:    WBC 12.3 (*)    Neutro Abs 10.7 (*)    All other components within normal limits  URINALYSIS, MICROSCOPIC (REFLEX) - Abnormal; Notable for the following:    Bacteria, UA FEW (*)    Squamous Epithelial / LPF 0-5 (*)    All other components within normal limits  LIPASE, BLOOD    EKG  EKG Interpretation None       Radiology Ct Abdomen Pelvis W Contrast  Result Date: 01/05/2017 CLINICAL DATA:  Acute right lower quadrant abdominal pain. EXAM: CT ABDOMEN AND PELVIS WITH CONTRAST TECHNIQUE: Multidetector CT imaging of the abdomen and pelvis was performed using the standard protocol following bolus administration of intravenous contrast. CONTRAST:  161mL ISOVUE-300 IOPAMIDOL (ISOVUE-300) INJECTION 61% COMPARISON:  None. FINDINGS: Lower chest: No acute abnormality. Hepatobiliary: No focal liver abnormality is seen. No gallstones, gallbladder wall thickening, or biliary dilatation. Pancreas: Unremarkable. No pancreatic ductal dilatation or surrounding inflammatory changes. Spleen: Normal in size without focal abnormality. Adrenals/Urinary Tract: Adrenal glands appear normal. Bilateral parapelvic cysts are noted. Bilateral nephrolithiasis is noted. No hydronephrosis or renal obstruction is noted. Urinary bladder is unremarkable. Stomach/Bowel: The stomach appears normal. There is no evidence of bowel obstruction. Sigmoid diverticulosis is noted without inflammation.  The appendix is enlarged and inflamed consistent with acute appendicitis. Small amount of soft tissue gas is seen in this area suggesting perforation. There is also noted wall thickening and enhancement involving the terminal ileum which may represent secondary inflammation secondary to the appendicitis. Less likely, Crohn's disease cannot be excluded. Vascular/Lymphatic: Aortic atherosclerosis. No enlarged abdominal or pelvic lymph nodes. Reproductive: Mild prostatic enlargement is noted. Other: No abdominal wall hernia or abnormality. No abdominopelvic ascites. Musculoskeletal: Status post right hip arthroplasty. No acute osseous abnormality is noted. IMPRESSION: The appendix is enlarged and inflamed consistent with acute appendicitis, with a small amount of soft tissue gas in this area suggesting perforation. Wall thickening and enhancement involving the adjacent terminal ileum is also noted, which most likely represent secondary inflammation from the appendicitis. Crohn's disease is also a possibility although felt to be less likely.  Critical Value/emergent results were called by telephone at the time of interpretation on 01/05/2017 at 1:07 pm to Dr. Reece Agar , who verbally acknowledged these results. Aortic atherosclerosis. Mild prostatic enlargement. Electronically Signed   By: Marijo Conception, M.D.   On: 01/05/2017 13:08    Procedures Procedures (including critical care time)  Medications Ordered in ED Medications  cefTRIAXone (ROCEPHIN) 2 g in dextrose 5 % 50 mL IVPB (not administered)    And  metroNIDAZOLE (FLAGYL) IVPB 500 mg (not administered)  ondansetron (ZOFRAN) injection 4 mg (4 mg Intravenous Given 01/05/17 1209)  sodium chloride 0.9 % bolus 1,000 mL (0 mLs Intravenous Stopped 01/05/17 1307)  morphine 4 MG/ML injection 4 mg (4 mg Intravenous Given 01/05/17 1213)  acetaminophen (TYLENOL) tablet 1,000 mg (1,000 mg Oral Given 01/05/17 1211)  iopamidol (ISOVUE-300) 61 % injection 100 mL (100  mLs Intravenous Contrast Given 01/05/17 1239)     Initial Impression / Assessment and Plan / ED Course  I have reviewed the triage vital signs and the nursing notes.  Pertinent labs & imaging results that were available during my care of the patient were reviewed by me and considered in my medical decision making (see chart for details).     66 y.o. male here with vague abd pain yesterday that localized to RLQ this morning, with fever/chills and n/v this morning, and some diarrhea/loose stools over last 2 days. On exam, febrile 100.6, but not septic appearing, with moderate RLQ TTP at mcburney's point, +guarding. Highly suspicious for appendicitis. Will give tylenol for fever, but otherwise keep NPO. Will get labs and CT scan. Will give fluids, zofran, morphine, and reassess shortly.   1:15 PM CBC w/diff with neutrophilic leukocytosis. CMP unremarkable. Lipase WNL. U/A with trace leuks, 0-5 squamous, few bacteria, likely sterile pyuria from appendicitis. CT abd/pelv showing acute appendicitis with local perforation, some inflammation to terminal ileum likely 2/2 appendicitis but Crohn's also possible although less likely. Consult surgery Dr. Redmond Pulling who wants him transferred to [facility]; empiric abx started. Will transfer now. Spoke to [facility] Jeneen Rinks at [facility] who is aware of his pending arrival.   Holding orders to be placed by admitting team. Please see their notes for further documentation of care. I appreciate their help with this pleasant pt's care. Pt stable at time of admission.     Final Clinical Impressions(s) / ED Diagnoses   Final diagnoses:  Acute appendicitis with localized peritonitis  Nausea and vomiting in adult patient  Neutrophilic leukocytosis  Fever, unspecified fever cause    New Prescriptions New Prescriptions   No medications on [address], [place], [place] 01/05/17 1321    Malvin Johns, MD 01/05/17 1408

## 2017-01-06 ENCOUNTER — Encounter (HOSPITAL_COMMUNITY): Payer: Self-pay | Admitting: General Surgery

## 2017-01-06 DIAGNOSIS — R1031 Right lower quadrant pain: Secondary | ICD-10-CM | POA: Diagnosis not present

## 2017-01-06 DIAGNOSIS — K429 Umbilical hernia without obstruction or gangrene: Secondary | ICD-10-CM | POA: Diagnosis present

## 2017-01-06 DIAGNOSIS — K219 Gastro-esophageal reflux disease without esophagitis: Secondary | ICD-10-CM | POA: Diagnosis present

## 2017-01-06 DIAGNOSIS — Z885 Allergy status to narcotic agent status: Secondary | ICD-10-CM | POA: Diagnosis not present

## 2017-01-06 DIAGNOSIS — K3580 Unspecified acute appendicitis: Secondary | ICD-10-CM | POA: Diagnosis not present

## 2017-01-06 DIAGNOSIS — N200 Calculus of kidney: Secondary | ICD-10-CM | POA: Diagnosis present

## 2017-01-06 DIAGNOSIS — Z87891 Personal history of nicotine dependence: Secondary | ICD-10-CM | POA: Diagnosis not present

## 2017-01-06 DIAGNOSIS — Z96652 Presence of left artificial knee joint: Secondary | ICD-10-CM | POA: Diagnosis present

## 2017-01-06 DIAGNOSIS — K353 Acute appendicitis with localized peritonitis: Secondary | ICD-10-CM | POA: Diagnosis present

## 2017-01-06 DIAGNOSIS — M1990 Unspecified osteoarthritis, unspecified site: Secondary | ICD-10-CM | POA: Diagnosis present

## 2017-01-06 DIAGNOSIS — Z96641 Presence of right artificial hip joint: Secondary | ICD-10-CM | POA: Diagnosis present

## 2017-01-06 MED ORDER — SODIUM CHLORIDE 0.9 % IV BOLUS (SEPSIS)
500.0000 mL | Freq: Once | INTRAVENOUS | Status: AC
  Administered 2017-01-06: 500 mL via INTRAVENOUS

## 2017-01-06 NOTE — Progress Notes (Signed)
1 Day Post-Op    CC:  Abdominal pain  Subjective: He looks pretty good this AM, considering findings at surgery.  He complains of burning on voiding.  Tolerating clears well, No flatus or BM.  Sites all look fine.  I cleaned the umbilical site, it was kind of bloody, but is fine.   Objective: Vital signs in last 24 hours: Temp:  [97.3 F (36.3 C)-100.6 F (38.1 C)] 97.3 F (36.3 C) (08/08 0438) Pulse Rate:  [57-105] 57 (08/08 0438) Resp:  [16-24] 20 (08/08 0438) BP: (82-149)/(45-88) 82/45 (08/08 0438) SpO2:  [96 %-100 %] 96 % (08/08 0438) Weight:  [90.7 kg (200 lb)] 90.7 kg (200 lb) (08/07 1123) Last BM Date: 01/05/17 NPO last PM 480 this AM 3000 IV Urine 500 Afebrile, BP down   No labs this AM Intake/Output from previous day: 08/07 0701 - 08/08 0700 In: 3033.3 [I.V.:1783.3; IV Piggyback:1250] Out: 526 [Urine:501; Blood:25] Intake/Output this shift: No intake/output data recorded.  General appearance: alert, cooperative and no distress Resp: clear to auscultation bilaterally GI: soft, sore, sites OK.  Rare BS, no flatus or BM  Lab Results:   Recent Labs  01/05/17 1147  WBC 12.3*  HGB 15.4  HCT 43.6  PLT 176    BMET  Recent Labs  01/05/17 1147  NA 138  K 3.8  CL 104  CO2 22  GLUCOSE 138*  BUN 18  CREATININE 1.03  CALCIUM 9.0   PT/INR No results for input(s): LABPROT, INR in the last 72 hours.   Recent Labs Lab 01/05/17 1147  AST 18  ALT 15*  ALKPHOS 48  BILITOT 1.1  PROT 7.0  ALBUMIN 4.5     Lipase     Component Value Date/Time   LIPASE 22 01/05/2017 1147     Medications: . acetaminophen  1,000 mg Oral Q6H  . enoxaparin (LOVENOX) injection  40 mg Subcutaneous Q24H  . pantoprazole (PROTONIX) IV  40 mg Intravenous QHS   . dextrose 5 % and 0.45 % NaCl with KCl 20 mEq/L 100 mL/hr at 01/05/17 2010  . piperacillin-tazobactam (ZOSYN)  IV Stopped (01/06/17 0919)   Anti-infectives    Start     Dose/Rate Route Frequency Ordered Stop   01/05/17 2000  piperacillin-tazobactam (ZOSYN) IVPB 3.375 g     3.375 g 12.5 mL/hr over 240 Minutes Intravenous Every 8 hours 01/05/17 1900     01/05/17 1315  cefTRIAXone (ROCEPHIN) 2 g in dextrose 5 % 50 mL IVPB     2 g 100 mL/hr over 30 Minutes Intravenous  Once 01/05/17 1308 01/05/17 1351   01/05/17 1315  metroNIDAZOLE (FLAGYL) IVPB 500 mg     500 mg 100 mL/hr over 60 Minutes Intravenous  Once 01/05/17 1308 01/05/17 1451     Assessment/Plan Perforated gangrenous appendicitis Nephrolithiasis without obstruction Hx of tobacco use FEN:  IV fluids/Clears ID:  Rocephin/Flagyl x 1 dose; Zosyn 01/05/17 =>> day 2 DVT:  Lovenox     Plan:  I will advance his diet to fulls and continue antibiotics.  Recheck labs in AM and decide on disposition.  LOS: 0 days    Kora Groom 01/06/2017 [phone]

## 2017-01-06 NOTE — Care Management Note (Signed)
Case Management Note  Patient Details  Name: Jeremy Norman MRN: [id number] Date of Birth: March 30, 1951  Subjective/Objective:     Perforated appendix and appendectomy               Action/Plan: Date:  January 06, 2017 Chart reviewed for concurrent status and case management needs. Will continue to follow patient progress. Discharge Planning: following for needs Expected discharge date: [id number] Velva Harman, BSN, [place], [place]  Expected Discharge Date:                  Expected Discharge Plan:  Home/Self Care  In-House Referral:     Discharge planning Services  CM Consult  Post Acute Care Choice:    Choice offered to:     DME Arranged:    DME Agency:     HH Arranged:    HH Agency:     Status of Service:  In process, will continue to follow  If discussed at Long Length of Stay Meetings, dates discussed:    Additional Comments:  Leeroy Cha, RN 01/06/2017, 8:30 AM

## 2017-01-07 LAB — CBC
HEMATOCRIT: 33.5 % — AB (ref 39.0–52.0)
HEMOGLOBIN: 11.6 g/dL — AB (ref 13.0–17.0)
MCH: 32.2 pg (ref 26.0–34.0)
MCHC: 34.6 g/dL (ref 30.0–36.0)
MCV: 93.1 fL (ref 78.0–100.0)
Platelets: 122 10*3/uL — ABNORMAL LOW (ref 150–400)
RBC: 3.6 MIL/uL — AB (ref 4.22–5.81)
RDW: 14.4 % (ref 11.5–15.5)
WBC: 9.1 10*3/uL (ref 4.0–10.5)

## 2017-01-07 LAB — BASIC METABOLIC PANEL
Anion gap: 5 (ref 5–15)
BUN: 12 mg/dL (ref 6–20)
CHLORIDE: 112 mmol/L — AB (ref 101–111)
CO2: 23 mmol/L (ref 22–32)
Calcium: 7.9 mg/dL — ABNORMAL LOW (ref 8.9–10.3)
Creatinine, Ser: 1.04 mg/dL (ref 0.61–1.24)
GFR calc Af Amer: >60 mL/min (ref >60)
GFR calc non Af Amer: >60 mL/min (ref >60)
GLUCOSE: 117 mg/dL — AB (ref 65–99)
POTASSIUM: 4.2 mmol/L (ref 3.5–5.1)
Sodium: 140 mmol/L (ref 135–145)

## 2017-01-07 MED ORDER — IBUPROFEN 200 MG PO TABS: ORAL_TABLET | ORAL | Status: AC

## 2017-01-07 MED ORDER — AMOXICILLIN-POT CLAVULANATE 875-125 MG PO TABS: 1.0000 | ORAL_TABLET | Freq: Two times a day (BID) | ORAL | 0 refills | Status: AC

## 2017-01-07 MED ORDER — OXYCODONE HCL 5 MG PO TABS: 5.0000 mg | ORAL_TABLET | ORAL | 0 refills | Status: DC | PRN

## 2017-01-07 MED ORDER — OXYCODONE HCL 5 MG PO TABS: 5.0000 mg | ORAL_TABLET | ORAL | 0 refills | Status: AC | PRN

## 2017-01-07 MED ORDER — AMOXICILLIN-POT CLAVULANATE 875-125 MG PO TABS: 1.0000 | ORAL_TABLET | Freq: Two times a day (BID) | ORAL | Status: DC

## 2017-01-07 MED ORDER — AMOXICILLIN-POT CLAVULANATE 875-125 MG PO TABS: 1.0000 | ORAL_TABLET | Freq: Two times a day (BID) | ORAL | 0 refills | Status: DC

## 2017-01-07 MED ORDER — IBUPROFEN 200 MG PO TABS: 600.0000 mg | ORAL_TABLET | Freq: Four times a day (QID) | ORAL | Status: DC | PRN

## 2017-01-07 MED ORDER — ACETAMINOPHEN 500 MG PO TABS: ORAL_TABLET | ORAL | 0 refills | Status: AC

## 2017-01-07 MED ORDER — SACCHAROMYCES BOULARDII 250 MG PO CAPS: 250.0000 mg | ORAL_CAPSULE | Freq: Two times a day (BID) | ORAL | Status: DC

## 2017-01-07 MED ORDER — SACCHAROMYCES BOULARDII 250 MG PO CAPS: ORAL_CAPSULE | ORAL | Status: AC

## 2017-01-07 NOTE — Discharge Summary (Signed)
Physician Discharge Summary  Patient ID: Jeremy Norman MRN: [id number] DOB/AGE: 03-Nov-1950 66 y.o.  Admit date: 01/05/2017 Discharge date: 01/07/2017  Admission Diagnoses:  Acute appendicitis with possible perforation Nephrolithiasis without obstruction Hx of tobacco use  Discharge Diagnoses:   Perforated gangrenous appendicitis Nephrolithiasis without obstruction Hx of tobacco use  Active Problems:   Acute appendicitis   Perforated appendicitis   PROCEDURES: Laparoscopic appendectomy 01/05/17, [place] Hospital Course:  Pt presented to [facility] today with the above noted complaints after evaluation at [facility].   Yesterday he has some generalized abdominal cramps and a few loose BM in the evening. He pretty much ignored it and went to bed.  He took some TUMS before bed.   He slept well overnight. This AM he had a sudden onset of worsening pain now In the RLQ at the dermatology office during checkout.  Pain was 8/10, he stared having chills, some nausea followed by some emesis.  On arrival to the ED he had a fever of 100.6. Work up in the ED shows:  Fever 100.6 down to 99.9. VSS.  Glucose is 138,  ALT 15, WBC 12.3 with left shift.  CT scan shows bilateral nephrolithiasis with no hydronephrosis or obstruction. Sigmoid diverticulosis is noted without inflammation. The appendix is enlarged and inflamed consistent with acute appendicitis. Small amount of soft tissue gas is seen in this area suggesting perforation. There is also noted wall thickening and enhancement involving the terminal ileum which may represent secondary inflammation secondary to the appendicitis. He was referred to Dr. Redmond Pulling and [facility].  Transferred to [facility] with pain in the RLQ.  He is tachycardic and fever now is 100.9, HR just around 100.   He was seen in the ED and taken to the OR later that afternoon to the OR.  The appendix was found to be inflamed. There were signs of necrosis.  There was perforation. There  was not abscess formation. Patient had a small pre-existing fascial defect at his umbilicus at 0.5 cm. This was enlarged slightly and used as the Trocar Site.  We expected him to have an ileus from the procedure and appendicitis, but he did really well.  We advanced his diet over the next 48 hours.  He was kept on IV antibiotics.  By the second post op day he was eating a regular diet, afebrile and labs showed the WBC to be normal .  He was sore but doing well.  He wanted to go home and was discharge on 8 more days of Augmentin.  Total of 10 days of antibiotic coverage.  He was sent on a probiotic, Tylenol, Ibuprofen and oxycodone for pain.   He was warned that abscess can form after the surgery and on antibiotics.  He was given instructions to call if he had any suggestion he was not improving.    CBC Latest Ref Rng & Units 01/07/2017 01/05/2017 12/17/2013  WBC 4.0 - 10.5 K/uL 9.1 12.3(H) 9.6  Hemoglobin 13.0 - 17.0 g/dL 11.6(L) 15.4 11.9(L)  Hematocrit 39.0 - 52.0 % 33.5(L) 43.6 34.2(L)  Platelets 150 - 400 K/uL 122(L) 176 131(L)   CMP Latest Ref Rng & Units 01/07/2017 01/05/2017 12/16/2013  Glucose 65 - 99 mg/dL 117(H) 138(H) 111(H)  BUN 6 - 20 mg/dL 12 18 14   Creatinine 0.61 - 1.24 mg/dL 1.04 1.03 0.92  Sodium 135 - 145 mmol/L 140 138 135(L)  Potassium 3.5 - 5.1 mmol/L 4.2 3.8 3.6(L)  Chloride 101 - 111 mmol/L  112(H) 104 101  CO2 22 - 32 mmol/L 23 22 24   Calcium 8.9 - 10.3 mg/dL 7.9(L) 9.0 8.0(L)  Total Protein 6.5 - 8.1 g/dL - 7.0 -  Total Bilirubin 0.3 - 1.2 mg/dL - 1.1 -  Alkaline Phos 38 - 126 U/L - 48 -  AST 15 - 41 U/L - 18 -  ALT 17 - 63 U/L - 15(L) -    Condition on discharge:  Improved  Disposition: 01-Home or Self Care  Discharge Instructions    Call MD for:    Complete by:  As directed    Temperature >101   Call MD for:  hives    Complete by:  As directed    Call MD for:  persistant dizziness or light-headedness    Complete by:  As directed    Call MD for:  persistant nausea  and vomiting    Complete by:  As directed    Call MD for:  redness, tenderness, or signs of infection (pain, swelling, redness, odor or green/yellow discharge around incision site)    Complete by:  As directed    Call MD for:  severe uncontrolled pain    Complete by:  As directed    Diet - low sodium heart healthy    Complete by:  As directed    Discharge instructions    Complete by:  As directed    See [facility] discharge instructions   Driving Restrictions    Complete by:  As directed    No driving while taking narcotic pain medication Otherwise may resume driving when feel safe   Increase activity slowly    Complete by:  As directed    Lifting restrictions    Complete by:  As directed    Do not lift, push, pull anything greater than 10 lbs for 2 weeks     Allergies as of 01/07/2017      Reactions   Morphine And Related Nausea And Vomiting      Medication List    TAKE these medications   acetaminophen 500 MG tablet Commonly known as:  TYLENOL Use Tylenol first for pain, take 1000 mg every 8 hours.   DO NOT TAKE MORE THAN 4000 MG OF TYLENOL PER DAY.  IT CAN HARM YOUR LIVER.   amoxicillin-clavulanate 875-125 MG tablet Commonly known as:  AUGMENTIN Take 1 tablet by mouth every 12 (twelve) hours.   Fish Oil 1200 MG Caps Take 1,200 mg by mouth daily.   ibuprofen 200 MG tablet Commonly known as:  ADVIL,MOTRIN You can take 2-3 of these every 8 hours as needed.  I would use it as second line for pain control.  Do not exceed this dosage. It can be hard on your kidneys and heart.   magnesium oxide 400 MG tablet Commonly known as:  MAG-OX Take 400 mg by mouth daily.   oxyCODONE 5 MG immediate release tablet Commonly known as:  Oxy IR/ROXICODONE Take 1 tablet (5 mg total) by mouth every 4 (four) hours as needed for moderate pain.   saccharomyces boulardii 250 MG capsule Commonly known as:  FLORASTOR You can buy this over the counter at any drug store, it is with the stool  products.  Take as directed and complete package. (1 month)   sildenafil 100 MG tablet Commonly known as:  VIAGRA Take 50-100 mg by mouth daily as needed for erectile dysfunction.   vitamin C 1000 MG tablet Take 1,000 mg by mouth daily.  Follow-up Information    Surgery, [place] Follow up on 01/21/2017.   Specialty:  General Surgery Why:  Your appointment is at 3:45 PM. Be at the office 30 minutes early. If you have fever, increased pain, nausea/vomiting, trouble voiding, just feel bad and don't know why, call and let them look you over.  Bring photo ID and insurance information with you. Contact information: [place] [id number] [phone]           Signed: Earnstine Regal 01/07/2017, 1:20 PM

## 2017-01-07 NOTE — Discharge Instructions (Signed)
[facility] LAPAROSCOPIC SUR[facility]: POST OP INSTRUCTIONS Always review your discharge instruction sheet given to you by the facility where your surgery was performed. IF YOU HAVE DISABILITY OR FAMILY LEAVE FORMS, YOU MUST BRING THEM TO THE OFFICE FOR PROCESSING.   DO NOT GIVE THEM TO YOUR DOCTOR.  1. A prescription for pain medication may be given to you upon discharge.  Take your pain medication as prescribed, if needed.  If narcotic pain medicine is not needed, then you may take acetaminophen (Tylenol) and/or or ibuprofen (Advil) as needed. 2. Take your usually prescribed medications unless otherwise directed. 3. If you need a refill on your pain medication, please contact your pharmacy.  They will contact our office to request authorization. Prescriptions will not be filled after 5pm or on week-ends. 4. You should follow a light diet the first few days after arrival home, such as soup and crackers, etc.  Be sure to include lots of fluids daily. 5. Most patients will experience some swelling and bruising in the area of the incisions.  Ice packs will help.  Swelling and bruising can take several days to resolve.  6. It is common to experience some constipation if taking pain medication after surgery.  Increasing fluid intake and taking a stool softener (such as Colace) will usually help or prevent this problem from occurring.  A mild laxative (Milk of Magnesia or Miralax) should be taken according to package instructions if there are no bowel movements after 48 hours. 7. Unless discharge instructions indicate otherwise, you may remove your bandages 24-48 hours after surgery, and you may shower at that time.  You may have steri-strips (small skin tapes) in place directly over the incision.  These strips should be left on the skin for 7-10 days.  If your surgeon used skin glue on the incision, you may shower in 24 hours.  The glue will flake off over the next 2-3 weeks.  Any sutures  or staples will be removed at the office during your follow-up visit. 8. ACTIVITIES:  You may resume regular (light) daily activities beginning the next day--such as daily self-care, walking, climbing stairs--gradually increasing activities as tolerated.  You may have sexual intercourse when it is comfortable.  Refrain from any heavy lifting or straining until approved by your doctor. a. You may drive when you are no longer taking prescription pain medication, you can comfortably wear a seatbelt, and you can safely maneuver your car and apply brakes. 9. You should see your doctor in the office for a follow-up appointment approximately 2-3 weeks after your surgery.  Make sure that you call for this appointment within a day or two after you arrive home to insure a convenient appointment time. 10. OTHER INSTRUCTIONS: Take a probiotic while on the antibiotic or eat a serving of yogurt (with probiotic/lactobacillus) daily   WHEN TO CALL YOUR DOCTOR: 1. Fever over 101.0 2. Inability to urinate 3. Continued bleeding from incision. 4. Increased pain, redness, or drainage from the incision. 5. Increasing abdominal pain  The clinic staff is available to answer your questions during regular business hours.  Please dont hesitate to call and ask to speak to one of the nurses for clinical concerns.  If you have a medical emergency, go to the nearest emergency room or call 911.  A surgeon from [facility] is always on call at the hospital. [address] [place], [place], [place]  [address] ? [address] [address], [place], [place]  [address] [phone] ? [phone] ? FAX [phone] Web site: [email or web address]   Laparoscopic Appendectomy, Adult, Care After These instructions give you information about caring for yourself after your procedure. Your doctor may also give you more specific instructions. Call your doctor if you have any problems or questions after your  procedure. Follow these instructions at home: Medicines  Take over-the-counter and prescription medicines only as told by your doctor.  Do not drive for 24 hours if you received a sedative.  Do not drive or use heavy machinery while taking prescription pain medicine.  If you were prescribed an antibiotic medicine, take it as told by your doctor. Do not stop taking it even if you start to feel better. Activity  Do not lift anything that is heavier than 10 pounds (4.5 kg) for 3 weeks or as told by your doctor.  Do not play contact sports for 3 weeks or as told by your doctor.  Slowly return to your normal activities. Bathing  Keep your cuts from surgery (incisions) clean and dry. ? Gently wash the cuts with soap and water. ? Rinse the cuts with water until the soap is gone. ? Pat the cuts dry with a clean towel. Do not rub the cuts.  You may take showers after 48 hours.  Do not take baths, swim, or use a hot tub for 2 weeks or as told by your doctor. Cut Care  Follow instructions from your doctor about how to take care of your cuts. Make sure you: ? Wash your hands with soap and water before you change your bandage (dressing). If you do not have soap and water, use hand sanitizer. ? Change your bandage as told by your doctor. ? Leave stitches (sutures), skin glue, or skin tape (adhesive) strips in place. They may need to stay in place for 2 weeks or longer. If tape strips get loose and curl up, you may trim the loose edges. Do not remove tape strips completely unless your doctor says it is okay.  Check your cuts every day for signs of infection. Check for: ? More redness, swelling, or pain. ? More fluid or blood. ? Warmth. ? Pus or a bad smell. Other Instructions  If you were sent home with a drain, follow instructions from your doctor about how to use it and care for it.  Take deep breaths. This helps to keep your lungs from getting swollen (inflamed).  To help with  constipation: ? Drink plenty of fluids. ? Eat plenty of fruits and vegetables.  Keep all follow-up visits as told by your doctor. This is important. Contact a doctor if:  You have more redness, swelling, or pain around a cut from surgery.  You have more fluid or blood coming from a cut.  Your cut feels warm to the touch.  You have pus or a bad smell coming from a cut or a bandage.  The edges of a cut break open after the stitches have been taken out.  You have pain in your shoulders that gets worse.  You feel dizzy or you pass out (faint).  You have shortness of breath.  You keep feeling sick to your stomach (nauseous).  You keep throwing up (vomiting).  You get diarrhea or you cannot control your poop.  You lose your appetite.  You have swelling or pain in your legs. Get help right away if:  You have a fever.  You get a rash.  You have trouble breathing.  You  have sharp pains in your chest. This information is not intended to replace advice given to you by your health care provider. Make sure you discuss any questions you have with your health care provider. Document Released: 03/14/2009 Document Revised: 10/24/2015 Document Reviewed: 11/05/2014 Elsevier Interactive Patient Education  2018 [employer].

## 2017-01-07 NOTE — Progress Notes (Signed)
Date: January 07, 2017 Chart reviewed for discharge orders: None found for case management. Tion Tse,BSN,RN3, CCM/[phone] 

## 2017-01-07 NOTE — Progress Notes (Signed)
2 Days Post-Op    OI:[id number] pain   Subjective: He is dressed  And ready to go home.  Sore but looks fine.  Sites OK.  Objective: Vital signs in last 24 hours: Temp:  [97.6 F (36.4 C)-97.9 F (36.6 C)] 97.8 F (36.6 C) (08/09 0600) Pulse Rate:  [50-59] 54 (08/09 0600) Resp:  [18-20] 20 (08/09 0600) BP: (88-96)/(47-56) 96/56 (08/09 0600) SpO2:  [96 %-98 %] 98 % (08/09 0600) Last BM Date: 01/07/17 960 PO 2550 IV Urine not recorded Afebrile, VSS  BP is still low Labs OK WBC down to 9.1 Intake/Output from previous day: 08/08 0701 - 08/09 0700 In: 3510 [P.O.:960; I.V.:2400; IV Piggyback:150] Out: -  Intake/Output this shift: No intake/output data recorded.  General appearance: alert, cooperative and no distress Resp: clear to auscultation bilaterally GI: soft sore, still feels a bit swollen.  Sites all look good.  Lab Results:   Recent Labs  01/05/17 1147 01/07/17 0651  WBC 12.3* 9.1  HGB 15.4 11.6*  HCT 43.6 33.5*  PLT 176 122*    BMET  Recent Labs  01/05/17 1147 01/07/17 0651  NA 138 140  K 3.8 4.2  CL 104 112*  CO2 22 23  GLUCOSE 138* 117*  BUN 18 12  CREATININE 1.03 1.04  CALCIUM 9.0 7.9*   PT/INR No results for input(s): LABPROT, INR in the last 72 hours.   Recent Labs Lab 01/05/17 1147  AST 18  ALT 15*  ALKPHOS 48  BILITOT 1.1  PROT 7.0  ALBUMIN 4.5     Lipase     Component Value Date/Time   LIPASE 22 01/05/2017 1147     Medications: . acetaminophen  1,000 mg Oral Q6H  . enoxaparin (LOVENOX) injection  40 mg Subcutaneous Q24H  . pantoprazole (PROTONIX) IV  40 mg Intravenous QHS    Assessment/Plan Perforated gangrenous appendicitis Nephrolithiasis without obstruction Hx of tobacco use FEN:  IV fluids/regular diet ID:  Rocephin/Flagyl x 1 dose; Zosyn 01/05/17 =>> day 3 DVT:  Lovenox     Plan:  Home today on 8 more days of Oral antibiotics and probiotic. I have personally reviewed the patients medication history on the  [place] controlled substance database.  LOS: 1 day    Jeremy Norman 01/07/2017 [phone]

## 2017-01-26 ENCOUNTER — Ambulatory Visit (INDEPENDENT_AMBULATORY_CARE_PROVIDER_SITE_OTHER): Payer: Medicare Other | Admitting: Podiatry

## 2017-01-26 ENCOUNTER — Encounter: Payer: Self-pay | Admitting: Podiatry

## 2017-01-26 DIAGNOSIS — M79672 Pain in left foot: Secondary | ICD-10-CM

## 2017-01-26 DIAGNOSIS — B351 Tinea unguium: Secondary | ICD-10-CM

## 2017-01-26 DIAGNOSIS — M79671 Pain in right foot: Secondary | ICD-10-CM

## 2017-01-26 NOTE — Progress Notes (Signed)
SUBJECTIVE: 66 y.o.year old malepresents requesting toe nails trimmed. All callused lesions are ok now.  Stated that he was in hospital with perforated appendix a few weeks ago. He has recovered well.   OBJECTIVE: DERMATOLOGIC EXAMINATION: Thick dystrophic nails x 10. Mild plantar calluses bilateral first MPJ area.  VASCULAR EXAMINATION OF LOWER LIMBS: All pedal pulses are palpable with normal pulsation.  Capillary Filling times within 3 seconds in all digits.  White discolored skin at 4th web space bilateral. Temperature gradient from tibial crest to dorsum of foot is within normal bilateral.  NEUROLOGIC EXAMINATION OF THE LOWER LIMBS: All epicritic and tactile sensations grossly intact.  MUSCULOSKELETAL EXAMINATION: Bone spur 5th toe left distal medial.   ASSESSMENT: Onychomycosis x 10 Plantar calluses bilateral. Pain in both feet.  PLAN: Reviewed clinical findings and available treatment options. All nails debrided. Return in 3 months for RFC.

## 2017-01-26 NOTE — Patient Instructions (Signed)
Seen for hypertrophic nails. All nails debrided. Return in 3 months or as needed.  

## 2017-02-09 ENCOUNTER — Ambulatory Visit: Payer: Medicare Other | Admitting: Podiatry

## 2017-05-06 DIAGNOSIS — Z23 Encounter for immunization: Secondary | ICD-10-CM | POA: Diagnosis not present

## 2017-05-11 ENCOUNTER — Ambulatory Visit: Payer: Medicare Other | Admitting: Podiatry

## 2017-06-15 ENCOUNTER — Ambulatory Visit: Payer: Medicare Other | Admitting: Podiatry

## 2017-06-22 ENCOUNTER — Ambulatory Visit (INDEPENDENT_AMBULATORY_CARE_PROVIDER_SITE_OTHER): Payer: Medicare Other | Admitting: Podiatry

## 2017-06-22 ENCOUNTER — Encounter: Payer: Self-pay | Admitting: Podiatry

## 2017-06-22 DIAGNOSIS — M79671 Pain in right foot: Secondary | ICD-10-CM | POA: Diagnosis not present

## 2017-06-22 DIAGNOSIS — B351 Tinea unguium: Secondary | ICD-10-CM | POA: Diagnosis not present

## 2017-06-22 DIAGNOSIS — M79672 Pain in left foot: Secondary | ICD-10-CM | POA: Diagnosis not present

## 2017-06-22 NOTE — Patient Instructions (Signed)
Seen for hypertrophic nails. All nails debrided. May scrub bottom of both feet with antibacterial soap at the end of each shower to reduce dry scaly skin. Return in 3 months or as needed.

## 2017-06-22 NOTE — Progress Notes (Signed)
Subjective: 67 y.o. year old male patient presents complaining of painful nails.   Objective: Dermatologic: Thick yellow deformed nails with yellow debris x 10. Hard dry peeling skin plantar bilateral. Vascular: Pedal pulses are all palpable. Orthopedic: No growth deformities. Neurologic: All epicritic and tactile sensations grossly intact.  Assessment: Dystrophic mycotic nails x 10. Pain in both feet.  Treatment: All mycotic nails debrided.  Return in 3 months or as needed.

## 2017-10-05 ENCOUNTER — Encounter: Payer: Self-pay | Admitting: Podiatry

## 2017-10-05 ENCOUNTER — Ambulatory Visit (INDEPENDENT_AMBULATORY_CARE_PROVIDER_SITE_OTHER): Payer: Medicare Other | Admitting: Podiatry

## 2017-10-05 DIAGNOSIS — L600 Ingrowing nail: Secondary | ICD-10-CM | POA: Diagnosis not present

## 2017-10-05 DIAGNOSIS — M79671 Pain in right foot: Secondary | ICD-10-CM

## 2017-10-05 DIAGNOSIS — B351 Tinea unguium: Secondary | ICD-10-CM | POA: Diagnosis not present

## 2017-10-05 DIAGNOSIS — M79672 Pain in left foot: Secondary | ICD-10-CM | POA: Diagnosis not present

## 2017-10-05 NOTE — Patient Instructions (Signed)
Seen for hypertrophic nails. All nails debrided. Return in 3 months or as needed.  

## 2017-10-05 NOTE — Progress Notes (Signed)
Subjective: 67 y.o. year old male patient presents complaining of painful nails. Patient requests toe nails trimmed.   Objective: Dermatologic: Thick yellow deformed nails x 10. Ingrown hallucal nails without infection. Vascular: Pedal pulses are all palpable. Orthopedic: No gross deformities. Neurologic: All epicritic and tactile sensations grossly intact.  Assessment: Dystrophic mycotic nails x 10. Ingrown hallucal nails without infection.   Treatment: All mycotic nails debrided.  Return in 3 months or as needed.

## 2018-01-05 ENCOUNTER — Ambulatory Visit (INDEPENDENT_AMBULATORY_CARE_PROVIDER_SITE_OTHER): Payer: Medicare Other | Admitting: Podiatry

## 2018-01-05 ENCOUNTER — Encounter: Payer: Self-pay | Admitting: Podiatry

## 2018-01-05 DIAGNOSIS — L600 Ingrowing nail: Secondary | ICD-10-CM

## 2018-01-05 DIAGNOSIS — M79672 Pain in left foot: Secondary | ICD-10-CM

## 2018-01-05 DIAGNOSIS — B351 Tinea unguium: Secondary | ICD-10-CM

## 2018-01-05 DIAGNOSIS — M79671 Pain in right foot: Secondary | ICD-10-CM | POA: Diagnosis not present

## 2018-01-05 DIAGNOSIS — L97521 Non-pressure chronic ulcer of other part of left foot limited to breakdown of skin: Secondary | ICD-10-CM

## 2018-01-05 NOTE — Patient Instructions (Signed)
Seen for hypertrophic nails. All nails debrided. Return in 3 months or as needed.  

## 2018-01-05 NOTE — Progress Notes (Signed)
Subjective: 67 y.o. year old male patient presents complaining of painful nails. Patient requests toe nails trimmed. Patient also complained of painful corn on 5th toe left, and in 4th web space right foot.  Objective: Dermatologic: Thick yellow deformed nails x 10. Ingrown hallucal nails. Pre ulcerative digital corn 4th web space right foot, thick keratoma distal medial 5th toe left. Vascular: Pedal pulses are all palpable. Orthopedic: Contracted 5th digits with interdigital lesions. Neurologic: All epicritic and tactile sensations grossly intact.  Assessment: Dystrophic mycotic nails x 10. Ingrown hallucal nails. 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 Pain in both feet.  Treatment: All mycotic nails debrided.  Interdigital lesions debrided. Reviewed available options. Instructed to keep clean dry cotton in between digits. Patient will return for Pre op.  Return in 3 months for palliation or as needed.

## 2018-01-10 DIAGNOSIS — L570 Actinic keratosis: Secondary | ICD-10-CM | POA: Diagnosis not present

## 2018-01-10 DIAGNOSIS — L821 Other seborrheic keratosis: Secondary | ICD-10-CM | POA: Diagnosis not present

## 2018-01-10 DIAGNOSIS — Z85828 Personal history of other malignant neoplasm of skin: Secondary | ICD-10-CM | POA: Diagnosis not present

## 2018-01-10 DIAGNOSIS — D171 Benign lipomatous neoplasm of skin and subcutaneous tissue of trunk: Secondary | ICD-10-CM | POA: Diagnosis not present

## 2018-01-25 DIAGNOSIS — Z23 Encounter for immunization: Secondary | ICD-10-CM | POA: Diagnosis not present

## 2018-04-12 ENCOUNTER — Ambulatory Visit: Payer: Medicare Other | Admitting: Podiatry

## 2018-09-23 IMAGING — CT CT ABD-PELV W/ CM
2 of 5 series · 16 of 46 positions shown, 18 images · IV contrast (iopamidol)
Comparison: None.

CLINICAL DATA: Acute right lower quadrant abdominal pain.

EXAM:
CT ABDOMEN AND PELVIS WITH CONTRAST
TECHNIQUE: Multidetector CT imaging of the abdomen and pelvis was performed
using the standard protocol following bolus administration of
intravenous contrast.
CONTRAST:  100mL [id number] IOPAMIDOL ([id number]) INJECTION 61%

[Series 2: axial (person_name) (person_name) · axial · 0.82mm/px · z∈[-492,-27]mm · 13 of 105 slices shown, 15 images]
[im 6/105  soft-tissue]
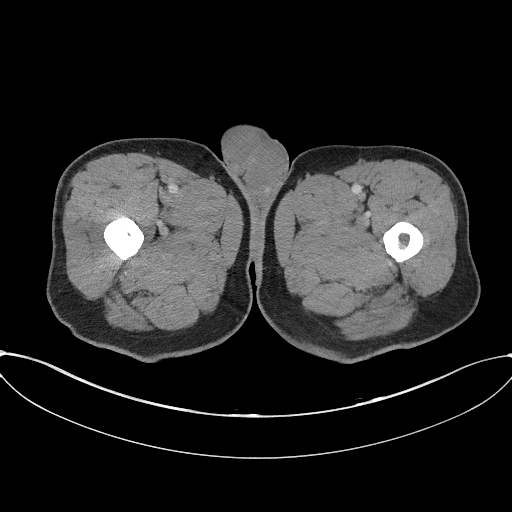
[im 6/105  bone]
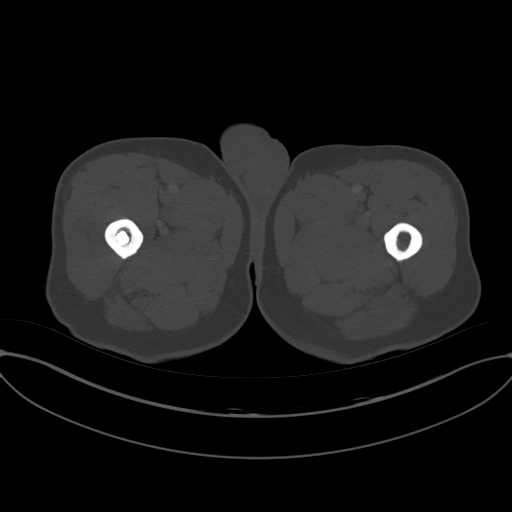
[im 17/105  soft-tissue]
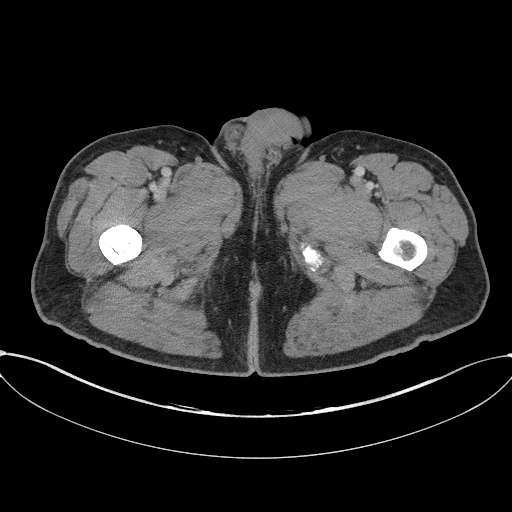
[im 22/105  soft-tissue]
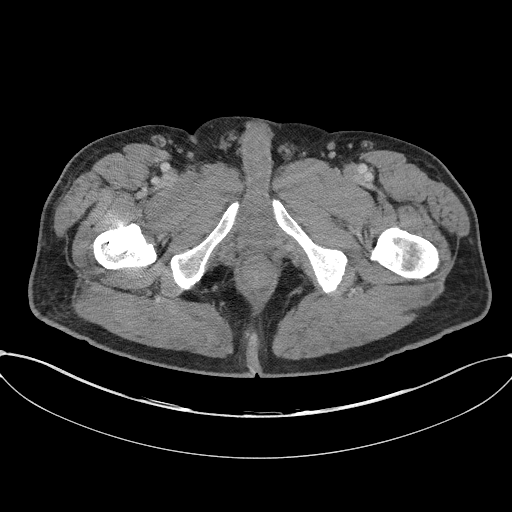
[im 28/105  soft-tissue]
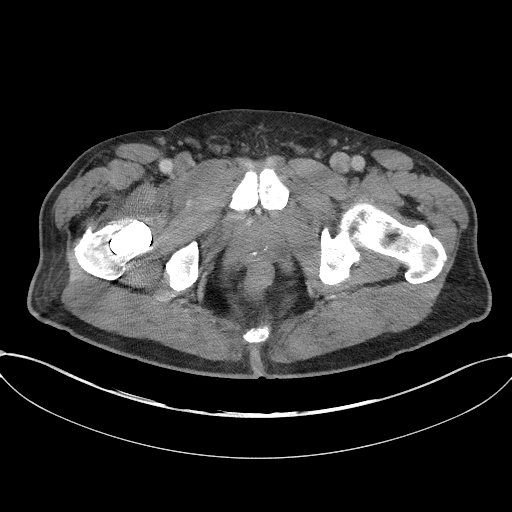
[im 39/105  soft-tissue]
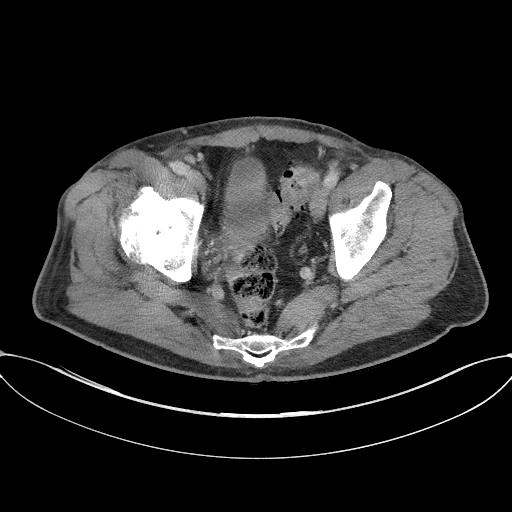
[im 44/105  soft-tissue]
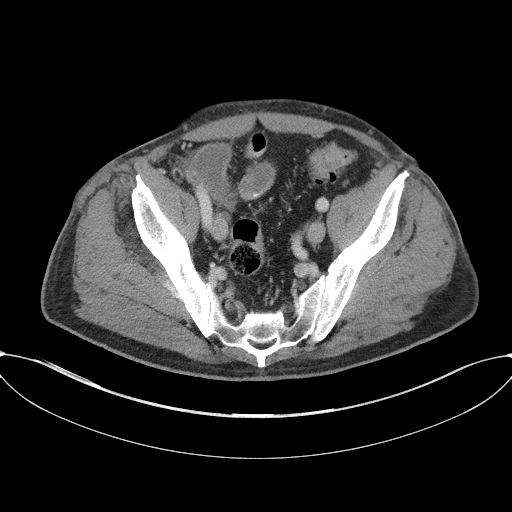
[im 55/105  soft-tissue]
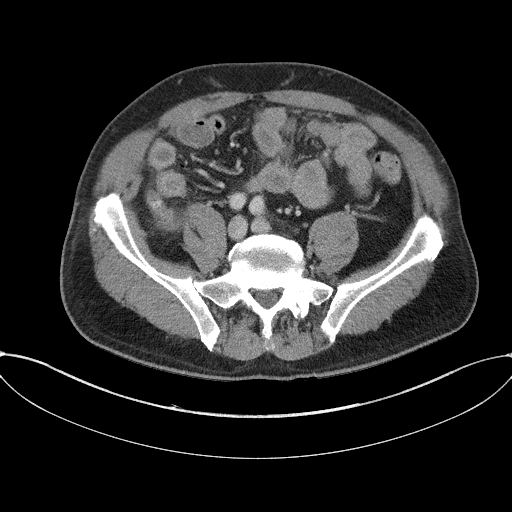
[im 61/105  soft-tissue]
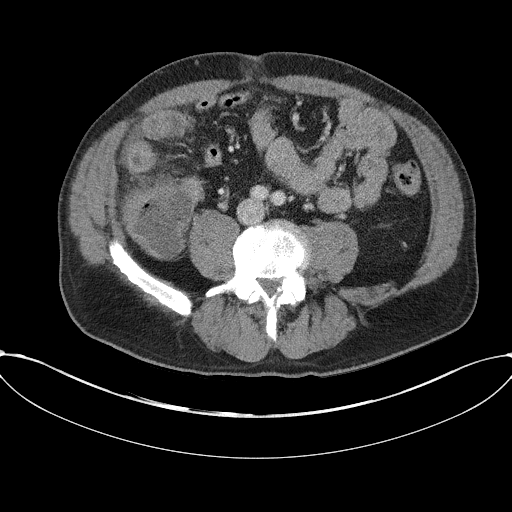
[im 66/105  soft-tissue]
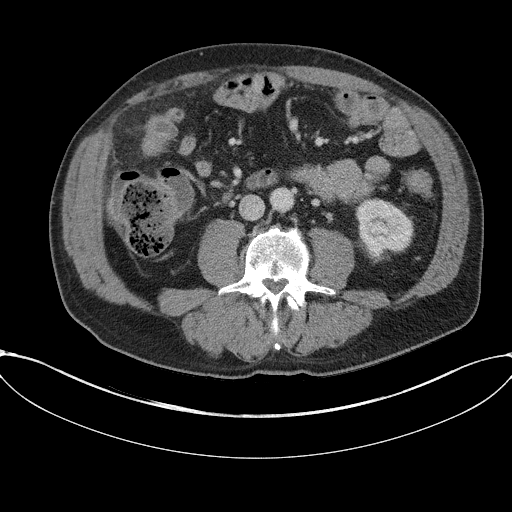
[im 66/105  bone]
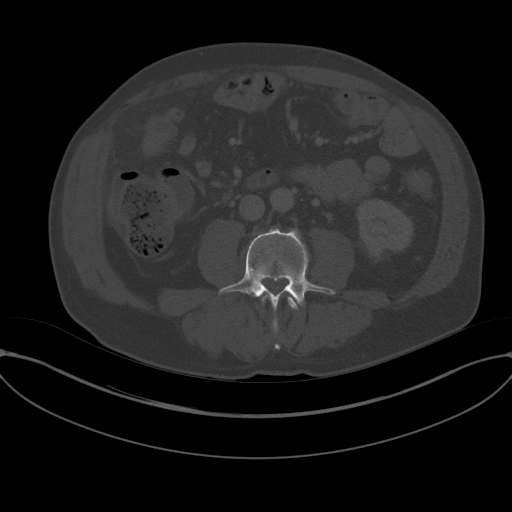
[im 77/105  soft-tissue]
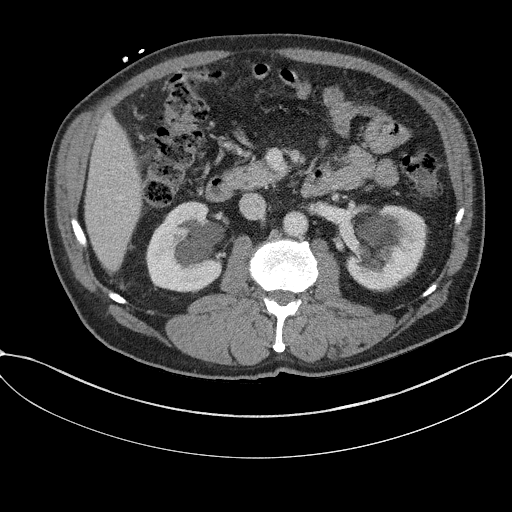
[im 83/105  soft-tissue]
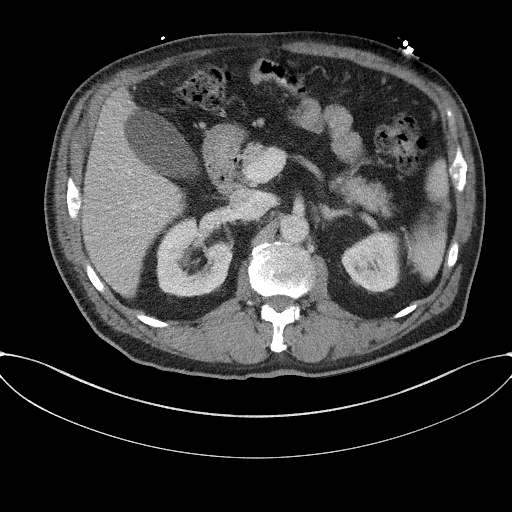
[im 88/105  soft-tissue]
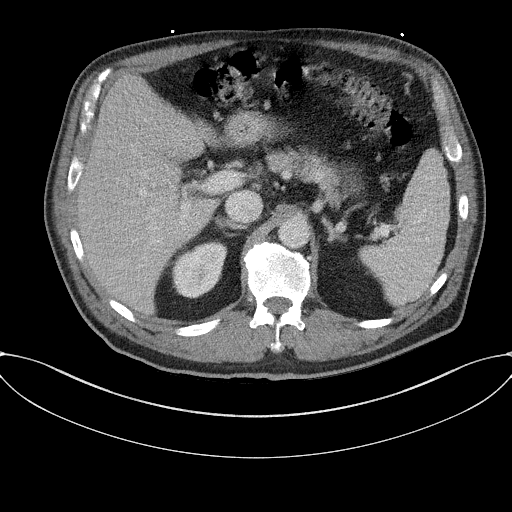
[im 99/105  soft-tissue]
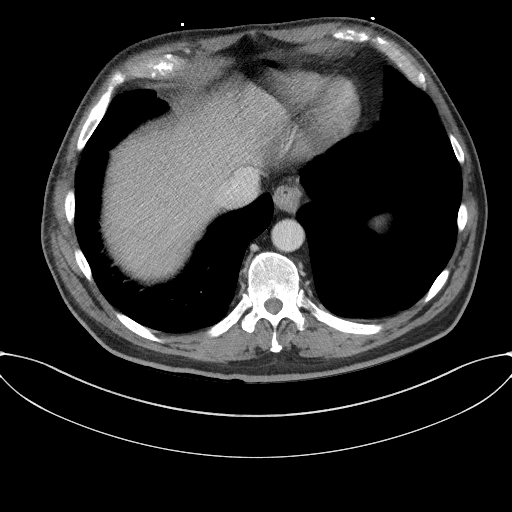

[Series 4: coronal st · coronal · 0.84mm/px · 3 of 101 slices shown]
[im 34/101  soft-tissue]
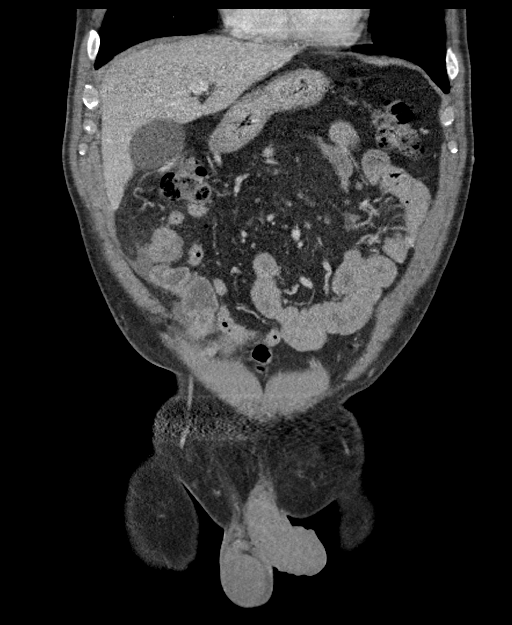
[im 45/101  soft-tissue]
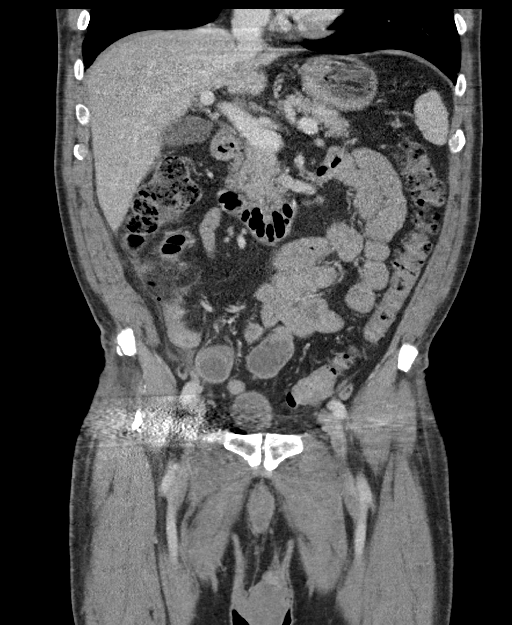
[im 56/101  soft-tissue]
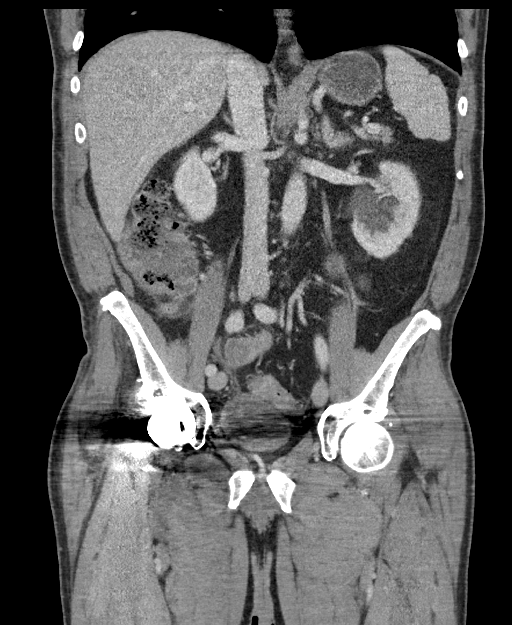

[16 of 46 positions shown; findings below may reference images not displayed]

FINDINGS: Lower chest: No acute abnormality.

Hepatobiliary: No focal liver abnormality is seen. No gallstones,
gallbladder wall thickening, or biliary dilatation.

Pancreas: Unremarkable. No pancreatic ductal dilatation or
surrounding inflammatory changes.

Spleen: Normal in size without focal abnormality.

Adrenals/Urinary Tract: Adrenal glands appear normal. Bilateral
parapelvic cysts are noted. Bilateral nephrolithiasis is noted. No
hydronephrosis or renal obstruction is noted. Urinary bladder is
unremarkable.

Stomach/Bowel: The stomach appears normal. There is no evidence of
bowel obstruction. Sigmoid diverticulosis is noted without
inflammation. The appendix is enlarged and inflamed consistent with
acute appendicitis. Small amount of soft tissue gas is seen in this
area suggesting perforation. There is also noted wall thickening and
enhancement involving the terminal ileum which may represent
secondary inflammation secondary to the appendicitis. Less likely,
Crohn's disease cannot be excluded.

Vascular/Lymphatic: Aortic atherosclerosis. No enlarged abdominal or
pelvic lymph nodes.

Reproductive: Mild prostatic enlargement is noted.

Other: No abdominal wall hernia or abnormality. No abdominopelvic
ascites.

Musculoskeletal: Status post right hip arthroplasty. No acute
osseous abnormality is noted.
IMPRESSION: The appendix is enlarged and inflamed consistent with acute
appendicitis, with a small amount of soft tissue gas in this area
suggesting perforation. Wall thickening and enhancement involving
the adjacent terminal ileum is also noted, which most likely
represent secondary inflammation from the appendicitis. Crohn's
disease is also a possibility although felt to be less likely.
Critical Value/emergent results were called by telephone at the time
of interpretation on 01/05/2017 at [DATE] to Dr. MAAMER KADOR ,
who verbally acknowledged these results.

Aortic atherosclerosis.

Mild prostatic enlargement.

## 2020-05-01 DEATH — deceased
